# Patient Record
Sex: Male | Born: 1960 | Race: White | Hispanic: No | Marital: Married | Smoking: Never smoker
Health system: Southern US, Community
[De-identification: ages and names within clinical notes are randomized; demographics above are authoritative.]

## PROBLEM LIST (undated history)

## (undated) DIAGNOSIS — M1990 Unspecified osteoarthritis, unspecified site: Secondary | ICD-10-CM

## (undated) DIAGNOSIS — I471 Supraventricular tachycardia, unspecified: Secondary | ICD-10-CM

## (undated) DIAGNOSIS — E785 Hyperlipidemia, unspecified: Secondary | ICD-10-CM

## (undated) DIAGNOSIS — G43909 Migraine, unspecified, not intractable, without status migrainosus: Secondary | ICD-10-CM

## (undated) DIAGNOSIS — R000 Tachycardia, unspecified: Secondary | ICD-10-CM

## (undated) DIAGNOSIS — R55 Syncope and collapse: Secondary | ICD-10-CM

## (undated) DIAGNOSIS — I77810 Thoracic aortic ectasia: Secondary | ICD-10-CM

## (undated) DIAGNOSIS — I10 Essential (primary) hypertension: Secondary | ICD-10-CM

## (undated) DIAGNOSIS — R7989 Other specified abnormal findings of blood chemistry: Secondary | ICD-10-CM

## (undated) DIAGNOSIS — I491 Atrial premature depolarization: Secondary | ICD-10-CM

## (undated) DIAGNOSIS — I493 Ventricular premature depolarization: Secondary | ICD-10-CM

## (undated) HISTORY — DX: Syncope and collapse: R55

## (undated) HISTORY — DX: Hyperlipidemia, unspecified: E78.5

## (undated) HISTORY — DX: Supraventricular tachycardia: I47.1

## (undated) HISTORY — PX: OTHER SURGICAL HISTORY: SHX169

## (undated) HISTORY — DX: Unspecified osteoarthritis, unspecified site: M19.90

## (undated) HISTORY — PX: TONSILLECTOMY: SUR1361

## (undated) HISTORY — DX: Thoracic aortic ectasia: I77.810

## (undated) HISTORY — DX: Atrial premature depolarization: I49.1

## (undated) HISTORY — PX: CHOLECYSTECTOMY: SHX55

## (undated) HISTORY — PX: APPENDECTOMY: SHX54

## (undated) HISTORY — DX: Essential (primary) hypertension: I10

## (undated) HISTORY — DX: Other specified abnormal findings of blood chemistry: R79.89

## (undated) HISTORY — DX: Migraine, unspecified, not intractable, without status migrainosus: G43.909

## (undated) HISTORY — DX: Tachycardia, unspecified: R00.0

## (undated) HISTORY — DX: Supraventricular tachycardia, unspecified: I47.10

## (undated) HISTORY — DX: Ventricular premature depolarization: I49.3

---

## 2015-05-14 ENCOUNTER — Encounter: Payer: Self-pay | Admitting: Internal Medicine

## 2015-05-14 ENCOUNTER — Ambulatory Visit (INDEPENDENT_AMBULATORY_CARE_PROVIDER_SITE_OTHER): Payer: BLUE CROSS/BLUE SHIELD | Admitting: Internal Medicine

## 2015-05-14 VITALS — BP 146/90 | HR 74 | Temp 97.8°F | Resp 16 | Ht 74.0 in | Wt 232.0 lb

## 2015-05-14 DIAGNOSIS — M150 Primary generalized (osteo)arthritis: Secondary | ICD-10-CM | POA: Diagnosis not present

## 2015-05-14 DIAGNOSIS — G43809 Other migraine, not intractable, without status migrainosus: Secondary | ICD-10-CM | POA: Diagnosis not present

## 2015-05-14 DIAGNOSIS — I10 Essential (primary) hypertension: Secondary | ICD-10-CM | POA: Insufficient documentation

## 2015-05-14 DIAGNOSIS — IMO0001 Reserved for inherently not codable concepts without codable children: Secondary | ICD-10-CM

## 2015-05-14 DIAGNOSIS — M159 Polyosteoarthritis, unspecified: Secondary | ICD-10-CM

## 2015-05-14 DIAGNOSIS — R030 Elevated blood-pressure reading, without diagnosis of hypertension: Secondary | ICD-10-CM

## 2015-05-14 DIAGNOSIS — Z9889 Other specified postprocedural states: Secondary | ICD-10-CM | POA: Diagnosis not present

## 2015-05-14 DIAGNOSIS — G43909 Migraine, unspecified, not intractable, without status migrainosus: Secondary | ICD-10-CM | POA: Insufficient documentation

## 2015-05-14 DIAGNOSIS — M1990 Unspecified osteoarthritis, unspecified site: Secondary | ICD-10-CM | POA: Insufficient documentation

## 2015-05-14 MED ORDER — EPINEPHRINE 0.3 MG/0.3ML IJ SOAJ: 0.3000 mg | Freq: Once | INTRAMUSCULAR | Status: DC

## 2015-05-14 NOTE — Progress Notes (Signed)
Pre visit review using our clinic review tool, if applicable. No additional management support is needed unless otherwise documented below in the visit note. 

## 2015-05-14 NOTE — Assessment & Plan Note (Signed)
Takes imitrex as needed, which is effective Continue imitrex a needed

## 2015-05-14 NOTE — Assessment & Plan Note (Signed)
Occasional transient skipped beat or palpitations - no concerning symptoms

## 2015-05-14 NOTE — Assessment & Plan Note (Addendum)
Exercising Can try glucosamine, tumeric or fish oil Working on weight loss

## 2015-05-14 NOTE — Patient Instructions (Addendum)
   No immunizations administered today.   Medications reviewed and updated.  No changes recommended at this time.  Your prescription(s) have been submitted to your pharmacy. Please take as directed and contact our office if you believe you are having problem(s) with the medication(s).   Please followup annually    

## 2015-05-14 NOTE — Assessment & Plan Note (Signed)
Elevated with increased weight He has started to exercise regularly and has lost weight He is monitoring his BP at home and it is coming down Goal less than 140/90 Work on decreasing sodium
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
## 2015-06-21 ENCOUNTER — Telehealth: Payer: Self-pay | Admitting: Internal Medicine

## 2015-06-21 DIAGNOSIS — Z9889 Other specified postprocedural states: Secondary | ICD-10-CM

## 2015-06-21 DIAGNOSIS — R002 Palpitations: Secondary | ICD-10-CM

## 2015-06-21 NOTE — Telephone Encounter (Signed)
Patient is requesting a referral to establish with a cardiologist based off of his health history he went over with Dr. Lawerance BachBurns on.

## 2015-06-21 NOTE — Telephone Encounter (Signed)
Please advise 

## 2015-06-21 NOTE — Telephone Encounter (Signed)
Ordered. He does need to get his cardiology records for them.

## 2015-06-25 NOTE — Telephone Encounter (Signed)
LVM informing pt

## 2015-07-20 ENCOUNTER — Encounter: Payer: Self-pay | Admitting: Internal Medicine

## 2015-08-27 ENCOUNTER — Encounter: Payer: Self-pay | Admitting: *Deleted

## 2015-09-13 ENCOUNTER — Encounter: Payer: Self-pay | Admitting: Interventional Cardiology

## 2015-09-13 ENCOUNTER — Encounter (INDEPENDENT_AMBULATORY_CARE_PROVIDER_SITE_OTHER): Payer: Self-pay

## 2015-09-13 ENCOUNTER — Ambulatory Visit (INDEPENDENT_AMBULATORY_CARE_PROVIDER_SITE_OTHER): Payer: BLUE CROSS/BLUE SHIELD | Admitting: Interventional Cardiology

## 2015-09-13 VITALS — BP 140/101 | HR 63 | Ht 74.0 in | Wt 225.0 lb

## 2015-09-13 DIAGNOSIS — I493 Ventricular premature depolarization: Secondary | ICD-10-CM | POA: Diagnosis not present

## 2015-09-13 DIAGNOSIS — Z9889 Other specified postprocedural states: Secondary | ICD-10-CM

## 2015-09-13 DIAGNOSIS — I471 Supraventricular tachycardia: Secondary | ICD-10-CM

## 2015-09-13 DIAGNOSIS — R030 Elevated blood-pressure reading, without diagnosis of hypertension: Secondary | ICD-10-CM | POA: Diagnosis not present

## 2015-09-13 DIAGNOSIS — I491 Atrial premature depolarization: Secondary | ICD-10-CM

## 2015-09-13 DIAGNOSIS — IMO0001 Reserved for inherently not codable concepts without codable children: Secondary | ICD-10-CM

## 2015-09-13 NOTE — Patient Instructions (Signed)
**Note De-identified Myer Bohlman Obfuscation** Medication Instructions:  Same-no changes  Labwork: None  Testing/Procedures: None  Follow-Up: Your physician wants you to follow-up in: 1 year. You will receive a reminder letter in the mail two months in advance. If you don't receive a letter, please call our office to schedule the follow-up appointment.      If you need a refill on your cardiac medications before your next appointment, please call your pharmacy.   

## 2015-09-13 NOTE — Progress Notes (Signed)
Cardiology Office Note   Date:  09/13/2015   ID:  Daniel Petty, DOB 1960-09-05, MRN [id number]  PCP:  Pincus Sanes, MD    No chief complaint on file.  F/u AVNRT  Wt Readings from Last 3 Encounters:  09/13/15 225 lb (102.1 kg)  05/14/15 232 lb (105.2 kg)       History of Present Illness: Daniel Petty is a 55 y.o. male  Who had a h/o SVT/ AVNRT.  He had am ablation in 2015.  He had syncope with HRs in the 200s prior to the ablation.  Since the ablation, he has not had any episodes like this.  Doing yardwork in the heat would cause these arrhythmias.  He had a negative stress test.   With exercise, he has occasional skipped beats.  He gets back into riding a bike.  He wants to run and lift weights. Monitor in 2015 showed NSR with PACs, PVCs.   3/15: ETT negative ; 3/15 echo: normal LV function  He has had hyperlipidemia and was on Crestor but stopped this recently.  He is watching his diet and the lipids are similar.   No family h/o CAD.  No early MI in the family.    Past Medical History:  Diagnosis Date  . Arthritis   . Hyperlipemia   . Hypertension   . Migraine   . Syncope   . Tachycardia    6 [place]    Past Surgical History:  Procedure Laterality Date  . APPENDECTOMY    . CHOLECYSTECTOMY    . psvt typical    . TONSILLECTOMY    . typical AVNRT       Current Outpatient Prescriptions  Medication Sig Dispense Refill  . EPINEPHrine 0.3 mg/0.3 mL IJ SOAJ injection Inject 0.3 mLs (0.3 mg total) into the muscle once. 2 Device 3  . SUMAtriptan Succinate (IMITREX PO) Take 1 tablet by mouth as needed (MIGRAINE).      No current facility-administered medications for this visit.     Allergies:   Penicillins; Yellow jacket venom; and Sulfa antibiotics    Social History:  The patient  reports that he has never smoked. He has never used smokeless tobacco. He reports that he drinks alcohol. He reports that he does not use drugs.    Family History:  The patient's family history includes Arrhythmia in his mother; Heart attack in his mother; Heart disease in his father; Hyperlipidemia in his maternal grandmother and mother; Multiple sclerosis in his brother and mother.    ROS:  Please see the history of present illness.   Otherwise, review of systems are positive for rare PACs and PVCs.   All other systems are reviewed and negative.    PHYSICAL EXAM: VS:  BP (!) 140/101   Pulse 63   Ht  (1.88 m)   Wt 225 lb (102.1 kg)   BMI 28.89 kg/m  , BMI Body mass index is 28.89 kg/m. GEN: Well nourished, well developed, in no acute distress  HEENT: normal  Neck: no JVD, carotid bruits, or masses Cardiac: RRR; no murmurs, rubs, or gallops,no edema  Respiratory:  clear to auscultation bilaterally, normal work of breathing GI: soft, nontender, nondistended, + BS MS: no deformity or atrophy  Skin: warm and dry, no rash Neuro:  Strength and sensation are intact Psych: euthymic mood, full affect   EKG:   The ekg ordered today demonstrates normal sinus rhythm, no ST segment  changes   Recent Labs: No results found for requested labs within last 8760 hours.   Lipid Panel No results found for: CHOL, TRIG, HDL, CHOLHDL, VLDL, LDLCALC, LDLDIRECT   Other studies Reviewed: Additional studies/ records that were reviewed today with results demonstrating: Normal ETT in 3/15; Normal LV function, tr AI/MR/TR .   ASSESSMENT AND PLAN:  1. 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 Status post ablation while he was living in [place] in 2015. 2. PACs, PVCs: Stable. He experiences these during exercise. Unchanged for several years. 3. Hyperlipidemia: Trying to manage with diet and exercise. Labs checked by his primary care physician. 4. Excellent exercise tolerance by most recent stress test. He will increase his activity level gradually. If he has any problems, he'll let us know. 5. Borderline BP, also managed with diet and  exercise.   Current medicines are reviewed at length with the patient today.  The patient concerns regarding his medicines were addressed.  The following changes have been made:  No change  Labs/ tests ordered today include:  No orders of the defined types were placed in this encounter.   Recommend 150 minutes/week of aerobic exercise Low fat, low carb, high fiber diet recommended  Disposition:   FU in 1 year   Signed, Lance Muss, MD  09/13/2015 11:42 AM    [facility] [address] [place], [place], [place]  [address] Phone: [phone]; Fax: [phone]

## 2015-12-21 ENCOUNTER — Ambulatory Visit (INDEPENDENT_AMBULATORY_CARE_PROVIDER_SITE_OTHER)
Admission: RE | Admit: 2015-12-21 | Discharge: 2015-12-21 | Disposition: A | Discharge status: Home / Self Care | Payer: BLUE CROSS/BLUE SHIELD | Source: Ambulatory Visit | Attending: Nurse Practitioner | Admitting: Nurse Practitioner

## 2015-12-21 ENCOUNTER — Ambulatory Visit (INDEPENDENT_AMBULATORY_CARE_PROVIDER_SITE_OTHER): Payer: BLUE CROSS/BLUE SHIELD | Admitting: Nurse Practitioner

## 2015-12-21 ENCOUNTER — Encounter: Payer: Self-pay | Admitting: Nurse Practitioner

## 2015-12-21 ENCOUNTER — Other Ambulatory Visit: Payer: Self-pay | Admitting: *Deleted

## 2015-12-21 VITALS — BP 162/110 | HR 86 | Temp 97.6°F | Ht 74.0 in | Wt 236.0 lb

## 2015-12-21 DIAGNOSIS — I10 Essential (primary) hypertension: Secondary | ICD-10-CM | POA: Diagnosis not present

## 2015-12-21 DIAGNOSIS — R053 Chronic cough: Secondary | ICD-10-CM

## 2015-12-21 DIAGNOSIS — R05 Cough: Secondary | ICD-10-CM

## 2015-12-21 DIAGNOSIS — R0982 Postnasal drip: Secondary | ICD-10-CM | POA: Diagnosis not present

## 2015-12-21 MED ORDER — MOMETASONE FUROATE 50 MCG/ACT NA SUSP: 2.0000 | Freq: Every day | NASAL | 2 refills | Status: DC

## 2015-12-21 MED ORDER — AMLODIPINE BESYLATE 5 MG PO TABS: 5.0000 mg | ORAL_TABLET | Freq: Every day | ORAL | 3 refills | Status: DC

## 2015-12-21 MED ORDER — MONTELUKAST SODIUM 10 MG PO TABS: 10.0000 mg | ORAL_TABLET | Freq: Every day | ORAL | 3 refills | Status: DC

## 2015-12-21 MED ORDER — HYDROCODONE-HOMATROPINE 5-1.5 MG/5ML PO SYRP: 5.0000 mL | ORAL_SOLUTION | Freq: Every evening | ORAL | 0 refills | Status: DC | PRN

## 2015-12-21 MED ORDER — ALBUTEROL SULFATE HFA 108 (90 BASE) MCG/ACT IN AERS: 1.0000 | INHALATION_SPRAY | Freq: Four times a day (QID) | RESPIRATORY_TRACT | 0 refills | Status: DC | PRN

## 2015-12-21 NOTE — Patient Instructions (Addendum)
You will be called with CXR results  Please check BP once a day and record. Bring BP records to next office visit.  Do not take any medication with decongestant

## 2015-12-21 NOTE — Progress Notes (Signed)
Subjective:  Patient ID: Daniel Petty, male    DOB: 12/15/1960  Age: 55 y.o. MRN: [id number]  CC: Cough (Pt stated coughing and headache for six weeks.)   Cough  This is a new problem. The current episode started more than 1 month ago. The problem has been waxing and waning. The problem occurs constantly. The cough is non-productive. Associated symptoms include chest pain, headaches, nasal congestion, postnasal drip and a sore throat. Pertinent negatives include no chills, ear congestion, ear pain, fever, heartburn, hemoptysis, myalgias, rash, rhinorrhea, shortness of breath, sweats, weight loss or wheezing. He has tried OTC cough suppressant for the symptoms. The treatment provided mild relief. His past medical history is significant for environmental allergies. There is no history of asthma, bronchitis, COPD, emphysema or pneumonia.    Outpatient Medications Prior to Visit  Medication Sig Dispense Refill  . EPINEPHrine 0.3 mg/0.3 mL IJ SOAJ injection Inject 0.3 mLs (0.3 mg total) into the muscle once. 2 Device 3  . FLUCELVAX QUADRIVALENT 0.5 ML SUSY     . SUMAtriptan Succinate (IMITREX PO) Take 1 tablet by mouth as needed (MIGRAINE).      No facility-administered medications prior to visit.     ROS See HPI  Objective:  BP (!) 162/110 (BP Location: Left Arm, Patient Position: Sitting, Cuff Size: Normal)   Pulse 86   Temp 97.6 F (36.4 C)   Ht 6\' 2"  (1.88 m)   Wt 236 lb (107 kg)   SpO2 98%   BMI 30.30 kg/m   BP Readings from Last 3 Encounters:  12/21/15 (!) 162/110  09/13/15 (!) 140/101  05/14/15 (!) 146/90    Wt Readings from Last 3 Encounters:  12/21/15 236 lb (107 kg)  09/13/15 225 lb (102.1 kg)  05/14/15 232 lb (105.2 kg)    Physical Exam  Constitutional: He is oriented to person, place, and time. No distress.  HENT:  Right Ear: Tympanic membrane, external ear and ear canal normal.  Left Ear: Tympanic membrane and ear canal normal.  Nose: Mucosal edema and  rhinorrhea present. Right sinus exhibits no maxillary sinus tenderness and no frontal sinus tenderness. Left sinus exhibits no maxillary sinus tenderness and no frontal sinus tenderness.  Mouth/Throat: Uvula is midline. Posterior oropharyngeal erythema present. No oropharyngeal exudate.  Eyes: No scleral icterus.  Neck: Normal range of motion. Neck supple.  Cardiovascular: Normal rate and regular rhythm.   Pulmonary/Chest: Effort normal and breath sounds normal.  Musculoskeletal: Normal range of motion. He exhibits no edema.  Lymphadenopathy:    He has no cervical adenopathy.  Neurological: He is alert and oriented to person, place, and time.  Skin: Skin is warm and dry. No rash noted. No erythema.  Vitals reviewed.   No results found for: WBC, HGB, HCT, PLT, GLUCOSE, CHOL, TRIG, HDL, LDLDIRECT, LDLCALC, ALT, AST, NA, K, CL, CREATININE, BUN, CO2, TSH, PSA, INR, GLUF, HGBA1C, MICROALBUR  Patient was never admitted.   normal CXR today  Assessment & Plan:   Loraine LericheMark was seen today for cough.  Diagnoses and all orders for this visit:  Essential hypertension, benign -     amLODipine (NORVASC) 5 MG tablet; Take 1 tablet (5 mg total) by mouth at bedtime.  Cough, persistent -     albuterol (PROVENTIL HFA;VENTOLIN HFA) 108 (90 Base) MCG/ACT inhaler; Inhale 1-2 puffs into the lungs every 6 (six) hours as needed for wheezing or shortness of breath. -     DG Chest 2 View; Future -  montelukast (SINGULAIR) 10 MG tablet; Take 1 tablet (10 mg total) by mouth at bedtime. -     HYDROcodone-homatropine (HYCODAN) 5-1.5 MG/5ML syrup; Take 5 mLs by mouth at bedtime as needed for cough.  PND (post-nasal drip) -     montelukast (SINGULAIR) 10 MG tablet; Take 1 tablet (10 mg total) by mouth at bedtime. -     mometasone (NASONEX) 50 MCG/ACT nasal spray; Place 2 sprays into the nose daily.   I am having Mr. Teddy SpikeDilliplaine start on amLODipine, albuterol, montelukast, HYDROcodone-homatropine, and mometasone.  I am also having him maintain his SUMAtriptan Succinate (IMITREX PO), EPINEPHrine, and FLUCELVAX QUADRIVALENT.  Meds ordered this encounter  Medications  . amLODipine (NORVASC) 5 MG tablet    Sig: Take 1 tablet (5 mg total) by mouth at bedtime.    Dispense:  30 tablet    Refill:  3    Order Specific Question:   Supervising Provider    Answer:   Tresa GarterPLOTNIKOV, ALEKSEI V [[id number]]  . albuterol (PROVENTIL HFA;VENTOLIN HFA) 108 (90 Base) MCG/ACT inhaler    Sig: Inhale 1-2 puffs into the lungs every 6 (six) hours as needed for wheezing or shortness of breath.    Dispense:  1 Inhaler    Refill:  0    Order Specific Question:   Supervising Provider    Answer:   Tresa GarterPLOTNIKOV, ALEKSEI V [[id number]]  . montelukast (SINGULAIR) 10 MG tablet    Sig: Take 1 tablet (10 mg total) by mouth at bedtime.    Dispense:  30 tablet    Refill:  3    Order Specific Question:   Supervising Provider    Answer:   Tresa GarterPLOTNIKOV, ALEKSEI V [[id number]]  . HYDROcodone-homatropine (HYCODAN) 5-1.5 MG/5ML syrup    Sig: Take 5 mLs by mouth at bedtime as needed for cough.    Dispense:  120 mL    Refill:  0    Order Specific Question:   Supervising Provider    Answer:   Tresa GarterPLOTNIKOV, ALEKSEI V [[id number]]  . mometasone (NASONEX) 50 MCG/ACT nasal spray    Sig: Place 2 sprays into the nose daily.    Dispense:  17 g    Refill:  2    Order Specific Question:   Supervising Provider    Answer:   Tresa GarterPLOTNIKOV, ALEKSEI V [[id number]]   Follow-up: Return in about 1 month (around 01/20/2016) for HTN and cough.  Alysia Pennaharlotte Elizet Kaplan, NP

## 2016-01-12 ENCOUNTER — Other Ambulatory Visit: Payer: Self-pay | Admitting: Nurse Practitioner

## 2016-01-12 DIAGNOSIS — R05 Cough: Secondary | ICD-10-CM

## 2016-01-12 DIAGNOSIS — R053 Chronic cough: Secondary | ICD-10-CM

## 2016-01-14 NOTE — Telephone Encounter (Signed)
Routing to [place], I dont see this med currently on patients chart----please advise, thanks

## 2016-01-22 ENCOUNTER — Encounter: Payer: Self-pay | Admitting: Internal Medicine

## 2016-01-22 ENCOUNTER — Ambulatory Visit (INDEPENDENT_AMBULATORY_CARE_PROVIDER_SITE_OTHER): Payer: BLUE CROSS/BLUE SHIELD | Admitting: Internal Medicine

## 2016-01-22 VITALS — BP 126/88 | HR 90 | Temp 98.2°F | Resp 16 | Wt 231.0 lb

## 2016-01-22 DIAGNOSIS — R05 Cough: Secondary | ICD-10-CM | POA: Diagnosis not present

## 2016-01-22 DIAGNOSIS — G43809 Other migraine, not intractable, without status migrainosus: Secondary | ICD-10-CM

## 2016-01-22 DIAGNOSIS — R059 Cough, unspecified: Secondary | ICD-10-CM

## 2016-01-22 DIAGNOSIS — I10 Essential (primary) hypertension: Secondary | ICD-10-CM

## 2016-01-22 LAB — POCT EXHALED NITRIC OXIDE: FeNO level (ppb): 12

## 2016-01-22 MED ORDER — OMEPRAZOLE 40 MG PO CPDR: 40.0000 mg | DELAYED_RELEASE_CAPSULE | Freq: Every day | ORAL | 3 refills | Status: DC

## 2016-01-22 NOTE — Patient Instructions (Addendum)
   All other Health Maintenance issues reviewed.   All recommended immunizations and age-appropriate screenings are up-to-date or discussed.  No immunizations administered today.   Medications reviewed and updated.  Changes include starting omeprazole 40 mg daily.  Your prescription(s) have been submitted to your pharmacy. Please take as directed and contact our office if you believe you are having problem(s) with the medication(s).   Please followup in 6 months

## 2016-01-22 NOTE — Progress Notes (Signed)
Pre visit review using our clinic review tool, if applicable. No additional management support is needed unless otherwise documented below in the visit note. 

## 2016-01-22 NOTE — Assessment & Plan Note (Signed)
Blood pressure well-controlled at home, diastolic slightly elevated here today Continue current medication at current doses Continue regular exercise and low sodium diet

## 2016-01-22 NOTE — Assessment & Plan Note (Signed)
Has a migraine approximately every 6 weeks Takes Imitrex as needed and is effective Continue above

## 2016-01-22 NOTE — Assessment & Plan Note (Addendum)
Started 3 months ago CXR neg 12/21/15 ? Allergies, GERD FENO done today - 12, unlikely asthma Has been taking allergy medication-some improvement  He can continue the allergy medication if helpful Deferred ENT or GI referral for evaluation of GERD Start omeprazole 40 mg daily for possible silent GERD Discussed at that this medication should be temporary and he should make lifestyle changes if he does have improvement in the cough Reviewed GERD diet

## 2016-01-22 NOTE — Progress Notes (Signed)
Subjective:    Patient ID: Daniel Petty, male    DOB: 09/01/1960, 55 y.o.   MRN: [id number]  HPI The patient is here for follow up.  Chronic cough: it started about 3 months ago.  It is dry.  He has occasional wheeze and chest tightness.  He denies SOB, fever.  He was here recently(one month ago) and a chest xray was normal.  He was prescribed an inhaler, nasal spray, singular.  The nasal spray causes headaches so he stopped it.  The others seem to help. Cough is worse late in day.  Cough drops help a little.   It is better with walking outside.  He had allergies as a child, but grew out of them.  He denies GERD.   Hypertension: He is taking his medication daily. He is compliant with a low sodium diet.  He denies chest pain, palpitations, edema, shortness of breath and regular headaches. He is exercising regularly - walking, weights.  He does monitor his blood pressure at home - 125/82.    Migraine headaches:  He continues to take imitrex only as needed which is about once every 6 weeks.  The imitrex works well and he denies side effects from the medication.  Medications and allergies reviewed with patient and updated if appropriate.  Patient Active Problem List   Diagnosis Date Noted  . PAC (premature atrial contraction) 09/13/2015  . PVC (premature ventricular contraction) 09/13/2015  . Migraines 05/14/2015  . Osteoarthritis 05/14/2015  . History of cardiac radiofrequency ablation 05/14/2015  . Essential hypertension, benign 05/14/2015    Current Outpatient Prescriptions on File Prior to Visit  Medication Sig Dispense Refill  . amLODipine (NORVASC) 5 MG tablet Take 1 tablet (5 mg total) by mouth at bedtime. 30 tablet 3  . EPINEPHrine 0.3 mg/0.3 mL IJ SOAJ injection Inject 0.3 mLs (0.3 mg total) into the muscle once. 2 Device 3  . montelukast (SINGULAIR) 10 MG tablet Take 1 tablet (10 mg total) by mouth at bedtime. 30 tablet 3  . PROAIR HFA 108 (90 Base) MCG/ACT inhaler INHALE 1  TO 2 PUFFS INTO THE LUNGS EVERY 6 HOURS AS NEEDED FOR WHEEZING OR SHORTNESS OF BREATH 8.5 g 0  . SUMAtriptan Succinate (IMITREX PO) Take 1 tablet by mouth as needed (MIGRAINE).      No current facility-administered medications on file prior to visit.     Past Medical History:  Diagnosis Date  . Arthritis   . Hyperlipemia   . Hypertension   . Migraine   . Syncope   . Tachycardia    6 [place]    Past Surgical History:  Procedure Laterality Date  . APPENDECTOMY    . CHOLECYSTECTOMY    . psvt typical    . TONSILLECTOMY    . typical AVNRT      Social History   Social History  . Marital status: Married    Spouse name: N/A  . Number of children: N/A  . Years of education: N/A   Social History Main Topics  . Smoking status: Never Smoker  . Smokeless tobacco: Never Used  . Alcohol use Yes  . Drug use: No  . Sexual activity: Not Asked   Other Topics Concern  . None   Social History Narrative   Exercise: regular    Family History  Problem Relation Age of Onset  . Hyperlipidemia Mother   . Heart attack Mother   . Multiple sclerosis Mother   .  Arrhythmia Mother   . Heart disease Father   . Multiple sclerosis Brother   . Hyperlipidemia Maternal Grandmother     Review of Systems  Constitutional: Negative for fever.  HENT: Positive for postnasal drip (at times). Negative for congestion.   Respiratory: Positive for cough, chest tightness and wheezing (occ). Negative for shortness of breath.   Cardiovascular: Negative for chest pain, palpitations and leg swelling.  Gastrointestinal: Negative for nausea.       No gerd, increased burping  Neurological: Positive for headaches (daily). Negative for dizziness and light-headedness.       Objective:   Vitals:   01/22/16 0928  BP: 126/88  Pulse: 90  Resp: 16  Temp: 98.2 F (36.8 C)   Filed Weights   01/22/16 0928  Weight: 231 lb (104.8 kg)   Body mass index is 29.66 kg/m.   Physical  Exam    Constitutional: Appears well-developed and well-nourished. No distress.  HENT:  Head: Normocephalic and atraumatic.  Neck: Neck supple. No tracheal deviation present. No thyromegaly present.  No cervical lymphadenopathy Cardiovascular: Normal rate, regular rhythm and normal heart sounds.   No murmur heard. No carotid bruit .  No edema Pulmonary/Chest: Effort normal and breath sounds normal. No respiratory distress. No has no wheezes. No rales.  Skin: Skin is warm and dry. Not diaphoretic.  Psychiatric: Normal mood and affect. Behavior is normal.      Assessment & Plan:    See Problem List for Assessment and Plan of chronic medical problems.   F/u in 6 months

## 2016-02-19 ENCOUNTER — Encounter: Payer: Self-pay | Admitting: Internal Medicine

## 2016-04-18 ENCOUNTER — Other Ambulatory Visit: Payer: Self-pay | Admitting: Nurse Practitioner

## 2016-04-18 DIAGNOSIS — I10 Essential (primary) hypertension: Secondary | ICD-10-CM

## 2016-05-21 ENCOUNTER — Other Ambulatory Visit: Payer: Self-pay | Admitting: Internal Medicine

## 2016-09-11 ENCOUNTER — Encounter: Payer: Self-pay | Admitting: Family Medicine

## 2016-09-11 ENCOUNTER — Ambulatory Visit (INDEPENDENT_AMBULATORY_CARE_PROVIDER_SITE_OTHER): Payer: Managed Care, Other (non HMO) | Admitting: Family Medicine

## 2016-09-11 ENCOUNTER — Other Ambulatory Visit (INDEPENDENT_AMBULATORY_CARE_PROVIDER_SITE_OTHER): Payer: Managed Care, Other (non HMO)

## 2016-09-11 VITALS — BP 140/88 | HR 64 | Temp 97.8°F | Ht 74.0 in | Wt 223.0 lb

## 2016-09-11 DIAGNOSIS — R5383 Other fatigue: Secondary | ICD-10-CM | POA: Diagnosis not present

## 2016-09-11 DIAGNOSIS — R5381 Other malaise: Secondary | ICD-10-CM | POA: Diagnosis not present

## 2016-09-11 LAB — CBC WITH DIFFERENTIAL/PLATELET
BASOS PCT: 1 % (ref 0.0–3.0)
Basophils Absolute: 0.1 10*3/uL (ref 0.0–0.1)
EOS PCT: 2.2 % (ref 0.0–5.0)
Eosinophils Absolute: 0.2 10*3/uL (ref 0.0–0.7)
HEMATOCRIT: 44.2 % (ref 39.0–52.0)
HEMOGLOBIN: 14.8 g/dL (ref 13.0–17.0)
LYMPHS PCT: 25.9 % (ref 12.0–46.0)
Lymphs Abs: 2 10*3/uL (ref 0.7–4.0)
MCHC: 33.6 g/dL (ref 30.0–36.0)
MCV: 90.4 fl (ref 78.0–100.0)
MONOS PCT: 7.7 % (ref 3.0–12.0)
Monocytes Absolute: 0.6 10*3/uL (ref 0.1–1.0)
Neutro Abs: 4.9 10*3/uL (ref 1.4–7.7)
Neutrophils Relative %: 63.2 % (ref 43.0–77.0)
Platelets: 275 10*3/uL (ref 150.0–400.0)
RBC: 4.89 Mil/uL (ref 4.22–5.81)
RDW: 13.5 % (ref 11.5–15.5)
WBC: 7.8 10*3/uL (ref 4.0–10.5)

## 2016-09-11 LAB — COMPREHENSIVE METABOLIC PANEL
ALBUMIN: 4.5 g/dL (ref 3.5–5.2)
ALK PHOS: 92 U/L (ref 39–117)
ALT: 12 U/L (ref 0–53)
AST: 12 U/L (ref 0–37)
BUN: 11 mg/dL (ref 6–23)
CALCIUM: 9.1 mg/dL (ref 8.4–10.5)
CHLORIDE: 104 meq/L (ref 96–112)
CO2: 29 mEq/L (ref 19–32)
Creatinine, Ser: 0.72 mg/dL (ref 0.40–1.50)
GFR: 119.1 mL/min (ref >60.00)
Glucose, Bld: 87 mg/dL (ref 70–99)
POTASSIUM: 4.6 meq/L (ref 3.5–5.1)
SODIUM: 138 meq/L (ref 135–145)
TOTAL PROTEIN: 6.9 g/dL (ref 6.0–8.3)
Total Bilirubin: 1.2 mg/dL (ref 0.2–1.2)

## 2016-09-11 LAB — C-REACTIVE PROTEIN: CRP: 0.1 mg/dL — ABNORMAL LOW (ref 0.5–20.0)

## 2016-09-11 LAB — TSH: TSH: 1.92 u[IU]/mL (ref 0.35–4.50)

## 2016-09-11 LAB — SEDIMENTATION RATE: SED RATE: 4 mm/h (ref 0–20)

## 2016-09-11 NOTE — Assessment & Plan Note (Signed)
Unclear source of his fatigue. Does have a family history of some autoimmune diseases. His exam does not demonstrate any of this today. Reviewed his lab work. - Lab work collected today. - If lab work is inconclusive then can consider a sleep study. - Given indications to return.

## 2016-09-11 NOTE — Progress Notes (Signed)
Daniel Petty - 56 y.o. male MRN [id number]  Date of birth: 07/03/1960  SUBJECTIVE:  Including CC & ROS.  Chief Complaint  Patient presents with  . Fatigue    extreme fatique with joint pain more than normal and more frequent migraines. Patient states lump behind right ear and swollen glands in neck    Daniel Petty is a 56 year old male that is presenting with fatigue and malaise. He reports these have been worsening over the past 6 months. He has not traveled outside of the country recently. He denies any new pets. Denies any tick bites. He denies any rashes. He has an extensive family history of lupus, rheumatoid arthritis, and osteoarthritis. He does have some joint swelling but denies any red or swollen or tender joints. He denies any fullness in his neck. He did notice a swollen lymph node on the right side of his neck. He denies any nausea or vomiting. He denies any recent illness. He denies any sick contacts.      Review of Systems  Constitutional: Negative for fever.  HENT: Negative for trouble swallowing.   Respiratory: Negative for shortness of breath.   Cardiovascular: Negative for chest pain.  Gastrointestinal: Negative for nausea and vomiting.  Endocrine: Negative for cold intolerance and heat intolerance.  Musculoskeletal: Positive for arthralgias and joint swelling.  Skin: Negative for rash.  Neurological: Negative for weakness and numbness.  Hematological: Positive for adenopathy.  Psychiatric/Behavioral: Negative for confusion.  otherwise negative  HISTORY: Past Medical, Surgical, Social, and Family History Reviewed & Updated per EMR.   Pertinent Historical Findings include:  Past Medical History:  Diagnosis Date  . Arthritis   . Hyperlipemia   . Hypertension   . Migraine   . Syncope   . Tachycardia    [place]    Past Surgical History:  Procedure Laterality Date  . APPENDECTOMY    . CHOLECYSTECTOMY    . psvt typical    .  TONSILLECTOMY    . typical AVNRT      Allergies  Allergen Reactions  . Penicillins Anaphylaxis  . Yellow Jacket Venom   . Sulfa Antibiotics Rash    Family History  Problem Relation Age of Onset  . Hyperlipidemia Mother   . Heart attack Mother   . Multiple sclerosis Mother   . Arrhythmia Mother   . Heart disease Father   . Multiple sclerosis Brother   . Hyperlipidemia Maternal Grandmother      Social History   Social History  . Marital status: Married    Spouse name: N/A  . Number of children: N/A  . Years of education: N/A   Occupational History  . Not on file.   Social History Main Topics  . Smoking status: Never Smoker  . Smokeless tobacco: Never Used  . Alcohol use Yes  . Drug use: No  . Sexual activity: Not on file   Other Topics Concern  . Not on file   Social History Narrative   Exercise: regular     PHYSICAL EXAM:  VS: BP 140/88 (BP Location: Left Arm, Patient Position: Sitting, Cuff Size: Normal)   Pulse 64   Temp 97.8 F (36.6 C) (Oral)   Ht 6\' 2"  (1.88 m)   Wt 223 lb (101.2 kg)   SpO2 100%   BMI 28.63 kg/m  Physical Exam  Constitutional: He is oriented to person, place, and time. He appears well-developed and well-nourished.  HENT:  Head: Normocephalic and atraumatic.  Right Ear: External ear normal.  Left Ear: External ear normal.  Eyes: Conjunctivae and EOM are normal.  Neck: Normal range of motion. Neck supple.  Cardiovascular: Normal rate, regular rhythm and normal heart sounds.   Pulmonary/Chest: Effort normal and breath sounds normal. He has no rales.  Abdominal: Soft. Bowel sounds are normal. There is no tenderness.  Musculoskeletal: Normal range of motion. He exhibits no edema.  Lymphadenopathy:    He has cervical adenopathy.  Neurological: He is alert and oriented to person, place, and time.  Skin: Skin is warm. No rash noted.  Psychiatric: He has a normal mood and affect. His behavior is normal.     ASSESSMENT & PLAN:     Malaise and fatigue Unclear source of his fatigue. Does have a family history of some autoimmune diseases. His exam does not demonstrate any of this today. Reviewed his lab work. - Lab work collected today. - If lab work is inconclusive then can consider a sleep study. - Given indications to return.  `sm

## 2016-09-11 NOTE — Patient Instructions (Signed)
Thank you for coming in,   We will call you with results from today. Based on what these show will determine our next steps.   Please feel free to call with any questions or concerns at any time, at [phone]. --Dr. Jordan LikesSchmitz

## 2016-09-12 LAB — ANA: ANA: NEGATIVE

## 2016-09-12 LAB — RHEUMATOID FACTOR: Rhuematoid fact SerPl-aCnc: <14 IU/mL (ref <14)

## 2016-09-12 LAB — CYCLIC CITRUL PEPTIDE ANTIBODY, IGG: Cyclic Citrullin Peptide Ab: <16 Units

## 2016-09-12 LAB — PATHOLOGIST SMEAR REVIEW

## 2016-09-16 NOTE — Progress Notes (Signed)
Subjective:    Patient ID: Daniel Petty, male    DOB: 1960-08-18, 56 y.o.   MRN: [id number]  HPI The patient is here for follow up.  He saw Dr Raeford Razor on 8/2 for fatigue.  He was having joint pain/swelling and migraines.  He had swollen glands in his neck.  The fatigue started 6 months ago.  He was not having fevers.  There was no travel or known tick bites.  He has a family history of autoimmune disease.    Blood work is unremarkable including: ANA, RA, esr/crp, CBC, tsh, cmp.  Fatigue started at least 6 months ago.  He started sleeping more - he was only sleeping 5-6 hrs, but sleeping more did not help.  All his joints hurt and he has some muscle pain.  The headache is constant.  He gets migraines every other week, which is more.  He has a lymph node that is swollen the posterior right neck.  It is not tender.   He thinks it is smaller today.   He is still working.  He is trying to exercise, but that is difficult due to the fatigue.  He denies increased fatigue with exercise.  He denies SOB, chest pain and palpitations.   He has had some erectile dysfunction and that is worse.   He is unsure if it is related or not.   He did have some tick bites last fall.  He does a lot of hiking.   Medications and allergies reviewed with patient and updated if appropriate.  Patient Active Problem List   Diagnosis Date Noted  . Malaise and fatigue 09/11/2016  . Cough 01/22/2016  . PAC (premature atrial contraction) 09/13/2015  . PVC (premature ventricular contraction) 09/13/2015  . Migraines 05/14/2015  . Osteoarthritis 05/14/2015  . History of cardiac radiofrequency ablation 05/14/2015  . Essential hypertension, benign 05/14/2015    Current Outpatient Prescriptions on File Prior to Visit  Medication Sig Dispense Refill  . amLODipine (NORVASC) 5 MG tablet Take 1 tablet (5 mg total) by mouth daily. 30 tablet 5  . EPINEPHrine 0.3 mg/0.3 mL IJ SOAJ injection Inject 0.3 mLs (0.3 mg total)  into the muscle once. 2 Device 3   No current facility-administered medications on file prior to visit.     Past Medical History:  Diagnosis Date  . Arthritis   . Hyperlipemia   . Hypertension   . Migraine   . Syncope   . Tachycardia    6 [place]    Past Surgical History:  Procedure Laterality Date  . APPENDECTOMY    . CHOLECYSTECTOMY    . psvt typical    . TONSILLECTOMY    . typical AVNRT      Social History   Social History  . Marital status: Married    Spouse name: N/A  . Number of children: N/A  . Years of education: N/A   Social History Main Topics  . Smoking status: Never Smoker  . Smokeless tobacco: Never Used  . Alcohol use Yes  . Drug use: No  . Sexual activity: Not Asked   Other Topics Concern  . None   Social History Narrative   Exercise: regular    Family History  Problem Relation Age of Onset  . Hyperlipidemia Mother   . Heart attack Mother   . Multiple sclerosis Mother   . Arrhythmia Mother   . Heart disease Father   . Multiple sclerosis Brother   .  Hyperlipidemia Maternal Grandmother     Review of Systems  Constitutional: Positive for fatigue. Negative for appetite change, chills, fever and unexpected weight change.  Eyes: Negative for visual disturbance.  Respiratory: Negative for cough, shortness of breath and wheezing.        No snoring, no witnessed apnea  Cardiovascular: Positive for palpitations (occasional, not new). Negative for chest pain and leg swelling.  Gastrointestinal: Negative for abdominal pain, blood in stool, constipation, diarrhea and nausea.  Genitourinary: Negative for dysuria and hematuria.  Musculoskeletal: Positive for arthralgias (more than chronic arthritis that was more locallized - this is everywhere - mild), back pain (chronic, unchanged) and myalgias. Negative for joint swelling.  Skin: Positive for rash (the other day - rash on right posterior leg).  Neurological: Positive for  light-headedness (occ) and headaches. Negative for numbness.       Objective:   Vitals:   09/17/16 0943  BP: 124/86  Pulse: 71  Resp: 16  Temp: 98 F (36.7 C)   Wt Readings from Last 3 Encounters:  09/17/16 222 lb (100.7 kg)  09/11/16 223 lb (101.2 kg)  01/22/16 231 lb (104.8 kg)   Body mass index is 28.5 kg/m.   Physical Exam    Constitutional: Appears well-developed and well-nourished. No distress.  HENT:  Head: Normocephalic and atraumatic.  Neck: Neck supple. No tracheal deviation present. No thyromegaly present.  one non tender posterior- lateral neck palpable lymph node. No occipital, auricular, mandibular or cervical lymphadenopathy Cardiovascular: Normal rate, regular rhythm and normal heart sounds.   No murmur heard. No carotid bruit .  No edema Pulmonary/Chest: Effort normal and breath sounds normal. No respiratory distress. No has no wheezes. No rales.  Abdomen: soft, non tender, non distended Msk: no joint swelling or erythema, no joint deformities Skin: Skin is warm and dry. Not diaphoretic.  No rashes Psychiatric: Normal mood and affect. Behavior is normal.      Assessment & Plan:    See Problem List for Assessment and Plan of chronic medical problems.

## 2016-09-17 ENCOUNTER — Other Ambulatory Visit: Payer: Self-pay | Admitting: Internal Medicine

## 2016-09-17 ENCOUNTER — Encounter: Payer: Self-pay | Admitting: Internal Medicine

## 2016-09-17 ENCOUNTER — Ambulatory Visit (INDEPENDENT_AMBULATORY_CARE_PROVIDER_SITE_OTHER): Payer: Managed Care, Other (non HMO) | Admitting: Internal Medicine

## 2016-09-17 ENCOUNTER — Other Ambulatory Visit: Payer: Managed Care, Other (non HMO)

## 2016-09-17 VITALS — BP 124/86 | HR 71 | Temp 98.0°F | Resp 16 | Wt 222.0 lb

## 2016-09-17 DIAGNOSIS — R5381 Other malaise: Secondary | ICD-10-CM

## 2016-09-17 DIAGNOSIS — R5383 Other fatigue: Secondary | ICD-10-CM | POA: Diagnosis not present

## 2016-09-17 DIAGNOSIS — M2550 Pain in unspecified joint: Secondary | ICD-10-CM

## 2016-09-17 NOTE — Patient Instructions (Signed)
  Test(s) ordered today. Your results will be released to MyChart (or called to you) after review, usually within 72hours after test completion. If any changes need to be made, you will be notified at that same time.    Medications reviewed and updated.  No changes recommended at this time.     

## 2016-09-17 NOTE — Assessment & Plan Note (Signed)
Cbc, cmp, rf, ana, ccp, esr, crp normal Present for > 6 months H/o of tick bites - will check lyme Has had some ED - will check testosterone, but I do not think that is the cause of his fatigue Atypical for OSA but will consider a sleep study

## 2016-09-17 NOTE — Assessment & Plan Note (Signed)
Cbc, cmp, rf, ccp, ana, esr, crp normal  Increased joint pain  Will check lyme May need to see rheumatology

## 2016-09-18 LAB — LYME AB/WESTERN BLOT REFLEX

## 2016-09-19 ENCOUNTER — Encounter: Payer: Self-pay | Admitting: Internal Medicine

## 2016-09-19 DIAGNOSIS — R5382 Chronic fatigue, unspecified: Secondary | ICD-10-CM

## 2016-09-19 DIAGNOSIS — R7989 Other specified abnormal findings of blood chemistry: Secondary | ICD-10-CM

## 2016-09-19 LAB — TESTOSTERONE, FREE, TOTAL, SHBG
Sex Hormone Binding: 27.6 nmol/L (ref 19.3–76.4)
Testosterone, Free: 7.5 pg/mL (ref 7.2–24.0)
Testosterone: 240 ng/dL — ABNORMAL LOW (ref 264–916)

## 2016-10-06 NOTE — Progress Notes (Signed)
Cardiology Office Note   Date:  10/07/2016   ID:  Daniel Petty, DOB 1960-08-27, MRN [id number]  PCP:  Binnie Rail, MD    No chief complaint on file.  F/u SVT  Wt Readings from Last 3 Encounters:  10/07/16 222 lb 12.8 oz (101.1 kg)  09/17/16 222 lb (100.7 kg)  09/11/16 223 lb (101.2 kg)       History of Present Illness: Daniel Petty is a 56 y.o. male  Who had a h/o SVT/ AVNRT.  He had am ablation in 2015.  He had syncope with HRs in the 200s prior to the ablation.  Since the ablation, he has not had any episodes like this.  Doing yardwork in the heat would cause these arrhythmias.  He had a negative stress test.   Monitor in 2015 showed NSR with PACs, PVCs.   3/15: ETT negative ; 11METS 3/15 echo: normal LV function  He has had hyperlipidemia and was on Crestor but stopped this due to perceived muscle aches.  He is watching his diet and the lipids are similar.  Pain did not improve quickly.    No family h/o CAD.  No early MI in the family.  He feels some palpitations when he is physically active.  Not like his prior SVT.  Just a skipped beat.  He has been more active and lost 18 lbs.  He tries to avoid salt.  Denies : Chest pain. Dizziness. Leg edema. Nitroglycerin use. Orthopnea. Palpitations. Paroxysmal nocturnal dyspnea. Shortness of breath. Syncope.   Amlodipine was started for HTN. 115/80 is a typically reading.    He reports some fatigue, joint pain and malaise.  He was found to have low T.  He will see urology.    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       Past Medical History:  Diagnosis Date  . Arthritis   . Hyperlipemia   . Hypertension   . Migraine   . Syncope   . Tachycardia    6 [place]    Past Surgical History:  Procedure Laterality Date  . APPENDECTOMY    . CHOLECYSTECTOMY    . psvt typical    . TONSILLECTOMY    . typical AVNRT       Current Outpatient Prescriptions  Medication Sig Dispense  Refill  . amLODipine (NORVASC) 5 MG tablet Take 1 tablet (5 mg total) by mouth daily. 30 tablet 5  . EPINEPHrine 0.3 mg/0.3 mL IJ SOAJ injection Inject 0.3 mLs (0.3 mg total) into the muscle once. 2 Device 3  . SUMAtriptan Succinate (IMITREX PO) Take by mouth daily as needed.     No current facility-administered medications for this visit.     Allergies:   Penicillins; Yellow jacket venom; and Sulfa antibiotics    Social History:  The patient  reports that he has never smoked. He has never used smokeless tobacco. He reports that he drinks alcohol. He reports that he does not use drugs.   Family History:  The patient's family history includes Arrhythmia in his mother; Heart attack in his mother; Heart disease in his father; Hyperlipidemia in his maternal grandmother and mother; Multiple sclerosis in his brother and mother.    ROS:  Please see the history of present illness.   Otherwise, review of systems are positive for intentional weight loss.   All other systems are reviewed and negative.    PHYSICAL EXAM: VS:  BP  126/90   Pulse 66   Ht '6\' 2"'  (1.88 m)   Wt 222 lb 12.8 oz (101.1 kg)   SpO2 96%   BMI 28.61 kg/m  , BMI Body mass index is 28.61 kg/m. GEN: Well nourished, well developed, in no acute distress  HEENT: normal  Neck: no JVD, carotid bruits, or masses Cardiac: RRR; no murmurs, rubs, or gallops,no edema  Respiratory:  clear to auscultation bilaterally, normal work of breathing GI: soft, nontender, nondistended, + BS MS: no deformity or atrophy  Skin: warm and dry, no rash Neuro:  Strength and sensation are intact Psych: euthymic mood, full affect   EKG:   The ekg ordered today demonstrates normal ECG, no ST changes   Recent Labs: 09/11/2016: ALT 12; BUN 11; Creatinine, Ser 0.72; Hemoglobin 14.8; Platelets 275.0; Potassium 4.6; Sodium 138; TSH 1.92   Lipid Panel No results found for: CHOL, TRIG, HDL, CHOLHDL, VLDL, LDLCALC, LDLDIRECT   Other studies  Reviewed: Additional studies/ records that were reviewed today with results demonstrating: Lipids not done.  ESR 4 in 8/18; Hbg: 14.8, TSH 1.9.   ASSESSMENT AND PLAN:  1. SVT : Status post ablation when he was living out of town. No symptoms like his prior SVT.  Has had PACs and PVC in the past. 2. Continue aggressive lifestyle changes. He has lost almost 20 pounds. He is trying to eat healthier and exercise more. This should help his energy level as well. We also spoke about his low testosterone. He will be seeing a urologist and supplementation will likely be considered. We talked about potential increased risk of thrombotic events while on supplemental testosterone. 3. Lipids need to be checked at some point.  THey should improve with his current lifestyle changes.   Current medicines are reviewed at length with the patient today.  The patient concerns regarding his medicines were addressed.  The following changes have been made:  No change  Labs/ tests ordered today include:  No orders of the defined types were placed in this encounter.   Recommend 150 minutes/week of aerobic exercise Low fat, low carb, high fiber diet recommended  Disposition:   FU in 1 year   Signed, Larae Grooms, MD  10/07/2016 10:24 AM    [facility] [address], [place], [place]  [address] Phone: [phone]; Fax: [phone]

## 2016-10-07 ENCOUNTER — Ambulatory Visit (INDEPENDENT_AMBULATORY_CARE_PROVIDER_SITE_OTHER): Payer: Managed Care, Other (non HMO) | Admitting: Interventional Cardiology

## 2016-10-07 ENCOUNTER — Encounter: Payer: Self-pay | Admitting: Interventional Cardiology

## 2016-10-07 VITALS — BP 126/90 | HR 66 | Ht 74.0 in | Wt 222.8 lb

## 2016-10-07 DIAGNOSIS — I491 Atrial premature depolarization: Secondary | ICD-10-CM

## 2016-10-07 DIAGNOSIS — I471 Supraventricular tachycardia: Secondary | ICD-10-CM | POA: Diagnosis not present

## 2016-10-07 NOTE — Patient Instructions (Signed)

## 2016-10-22 ENCOUNTER — Other Ambulatory Visit: Payer: Self-pay | Admitting: Internal Medicine

## 2016-10-22 DIAGNOSIS — I10 Essential (primary) hypertension: Secondary | ICD-10-CM

## 2016-12-23 ENCOUNTER — Other Ambulatory Visit: Payer: Self-pay | Admitting: Internal Medicine

## 2016-12-23 DIAGNOSIS — I10 Essential (primary) hypertension: Secondary | ICD-10-CM

## 2017-01-23 ENCOUNTER — Other Ambulatory Visit: Payer: Self-pay | Admitting: Internal Medicine

## 2017-01-23 DIAGNOSIS — I10 Essential (primary) hypertension: Secondary | ICD-10-CM

## 2017-02-06 ENCOUNTER — Other Ambulatory Visit: Payer: Self-pay | Admitting: Internal Medicine

## 2017-02-06 DIAGNOSIS — I10 Essential (primary) hypertension: Secondary | ICD-10-CM

## 2017-02-06 MED ORDER — AMLODIPINE BESYLATE 5 MG PO TABS: 5.0000 mg | ORAL_TABLET | Freq: Every day | ORAL | 0 refills | Status: DC

## 2017-02-24 ENCOUNTER — Ambulatory Visit: Payer: Self-pay | Admitting: Internal Medicine

## 2017-03-04 NOTE — Progress Notes (Signed)
Subjective:    Patient ID: Daniel Petty, male    DOB: [birth year], 59 y.o.   MRN: [id number]  HPI The patient is here for follow up.  Hypertension: He is taking his medication daily. He is compliant with a low sodium diet.  He denies chest pain, palpitations, edema, shortness of breath and regular headaches. He is exercising regularly.  He does monitor his blood pressure at work and it is always less than 140 - usually in the 120's.    He has gained weight over the holidays and will work on losing it - he knows he should lose about 30 lbs.    Medications and allergies reviewed with patient and updated if appropriate.  Patient Active Problem List   Diagnosis Date Noted  . SVT (supraventricular tachycardia) (HCC) 10/07/2016  . Low testosterone 09/19/2016  . Arthralgia 09/17/2016  . Malaise and fatigue 09/11/2016  . Cough 01/22/2016  . PAC (premature atrial contraction) 09/13/2015  . PVC (premature ventricular contraction) 09/13/2015  . Migraines 05/14/2015  . Osteoarthritis 05/14/2015  . History of cardiac radiofrequency ablation 05/14/2015  . Essential hypertension, benign 05/14/2015    Current Outpatient Medications on File Prior to Visit  Medication Sig Dispense Refill  . amLODipine (NORVASC) 5 MG tablet Take 1 tablet (5 mg total) by mouth daily. 30 tablet 0  . EPINEPHrine 0.3 mg/0.3 mL IJ SOAJ injection Inject 0.3 mLs (0.3 mg total) into the muscle once. 2 Device 3  . SUMAtriptan Succinate (IMITREX PO) Take by mouth daily as needed.     No current facility-administered medications on file prior to visit.     Past Medical History:  Diagnosis Date  . Arthritis   . Hyperlipemia   . Hypertension   . Migraine   . Syncope   . Tachycardia    6 [place] in [place]    Past Surgical History:  Procedure Laterality Date  . APPENDECTOMY    . CHOLECYSTECTOMY    . psvt typical    . TONSILLECTOMY    . typical AVNRT      Social History   Socioeconomic History    . Marital status: Married    Spouse name: None  . Number of children: None  . Years of education: None  . Highest education level: None  Social Needs  . Financial resource strain: None  . Food insecurity - worry: None  . Food insecurity - inability: None  . Transportation needs - medical: None  . Transportation needs - non-medical: None  Occupational History  . None  Tobacco Use  . Smoking status: Never Smoker  . Smokeless tobacco: Never Used  Substance and Sexual Activity  . Alcohol use: Yes  . Drug use: No  . Sexual activity: None  Other Topics Concern  . None  Social History Narrative   Exercise: regular    Family History  Problem Relation Age of Onset  . Hyperlipidemia Mother   . Heart attack Mother   . Multiple sclerosis Mother   . Arrhythmia Mother   . Heart disease Father   . Multiple sclerosis Brother   . Hyperlipidemia Maternal Grandmother     Review of Systems  Constitutional: Negative for chills and fever.  Respiratory: Positive for cough (x 1 week - dry, allergy). Negative for shortness of breath and wheezing.   Cardiovascular: Negative for chest pain, palpitations and leg swelling.  Neurological: Positive for headaches (daily, migraines once a month). Negative for dizziness and light-headedness.  Objective:   Vitals:   03/05/17 0856  BP: (!) 144/90  Pulse: 78  Resp: 16  Temp: 98.6 F (37 C)  SpO2: 97%   Wt Readings from Last 3 Encounters:  03/05/17 232 lb (105.2 kg)  10/07/16 222 lb 12.8 oz (101.1 kg)  09/17/16 222 lb (100.7 kg)   Body mass index is 29.79 kg/m.   Physical Exam    Constitutional: Appears well-developed and well-nourished. No distress.  HENT:  Head: Normocephalic and atraumatic.  Neck: Neck supple. No tracheal deviation present. No thyromegaly present.  No cervical lymphadenopathy Cardiovascular: Normal rate, regular rhythm and normal heart sounds.   No murmur heard. No carotid bruit .  No  edema Pulmonary/Chest: Effort normal and breath sounds normal. No respiratory distress. No has no wheezes. No rales.  Skin: Skin is warm and dry. Not diaphoretic.  Psychiatric: Normal mood and affect. Behavior is normal.      Assessment & Plan:    See Problem List for Assessment and Plan of chronic medical problems.

## 2017-03-05 ENCOUNTER — Encounter: Payer: Self-pay | Admitting: Internal Medicine

## 2017-03-05 ENCOUNTER — Ambulatory Visit: Payer: Managed Care, Other (non HMO) | Admitting: Internal Medicine

## 2017-03-05 VITALS — BP 144/90 | HR 78 | Temp 98.6°F | Resp 16 | Wt 232.0 lb

## 2017-03-05 DIAGNOSIS — I10 Essential (primary) hypertension: Secondary | ICD-10-CM | POA: Diagnosis not present

## 2017-03-05 MED ORDER — AMLODIPINE BESYLATE 5 MG PO TABS: 5.0000 mg | ORAL_TABLET | Freq: Every day | ORAL | 3 refills | Status: DC

## 2017-03-05 NOTE — Assessment & Plan Note (Addendum)
Well controlled - monitors it at work BP well controlled Current regimen effective and well tolerated Continue current medications at current doses cmp up to date and has annual blood work at work Will work on Ray[employer]weight loss - decreasing portions Follow up annually

## 2017-03-05 NOTE — Patient Instructions (Addendum)
  No immunizations administered today.   Medications reviewed and updated.  No changes recommended at this time.  Your prescription(s) have been submitted to your pharmacy. Please take as directed and contact our office if you believe you are having problem(s) with the medication(s).   Please followup in one year    

## 2017-04-29 ENCOUNTER — Encounter: Payer: Self-pay | Admitting: Internal Medicine

## 2017-04-30 ENCOUNTER — Encounter: Payer: Self-pay | Admitting: Family Medicine

## 2017-04-30 ENCOUNTER — Ambulatory Visit (INDEPENDENT_AMBULATORY_CARE_PROVIDER_SITE_OTHER)
Admission: RE | Admit: 2017-04-30 | Discharge: 2017-04-30 | Disposition: A | Discharge status: Home / Self Care | Payer: Managed Care, Other (non HMO) | Source: Ambulatory Visit | Attending: Family Medicine | Admitting: Family Medicine

## 2017-04-30 ENCOUNTER — Ambulatory Visit: Payer: Managed Care, Other (non HMO) | Admitting: Family Medicine

## 2017-04-30 VITALS — BP 142/80 | HR 80 | Temp 98.2°F | Wt 231.0 lb

## 2017-04-30 DIAGNOSIS — R059 Cough, unspecified: Secondary | ICD-10-CM

## 2017-04-30 DIAGNOSIS — R05 Cough: Secondary | ICD-10-CM | POA: Diagnosis not present

## 2017-04-30 DIAGNOSIS — R0982 Postnasal drip: Secondary | ICD-10-CM | POA: Diagnosis not present

## 2017-04-30 MED ORDER — FLUTICASONE PROPIONATE 50 MCG/ACT NA SUSP: 1.0000 | Freq: Every day | NASAL | 0 refills | Status: DC

## 2017-04-30 MED ORDER — BENZONATATE 100 MG PO CAPS: 100.0000 mg | ORAL_CAPSULE | Freq: Two times a day (BID) | ORAL | 0 refills | Status: DC | PRN

## 2017-04-30 MED ORDER — CETIRIZINE HCL 10 MG PO TABS
10.0000 mg | ORAL_TABLET | Freq: Every day | ORAL | 11 refills | Status: DC
Start: 2017-04-30 — End: 2017-06-18

## 2017-04-30 NOTE — Patient Instructions (Signed)
Cough, Adult Coughing is a reflex that clears your throat and your airways. Coughing helps to heal and protect your lungs. It is normal to cough occasionally, but a cough that happens with other symptoms or lasts a long time may be a sign of a condition that needs treatment. A cough may last only 2-3 weeks (acute), or it may last longer than 8 weeks (chronic). What are the causes? Coughing is commonly caused by:  Breathing in substances that irritate your lungs.  A viral or bacterial respiratory infection.  Allergies.  Asthma.  Postnasal drip.  Smoking.  Acid backing up from the stomach into the esophagus (gastroesophageal reflux).  Certain medicines.  Chronic lung problems, including COPD (or rarely, lung cancer).  Other medical conditions such as heart failure.  Follow these instructions at home: Pay attention to any changes in your symptoms. Take these actions to help with your discomfort:  Take medicines only as told by your health care provider. ? If you were prescribed an antibiotic medicine, take it as told by your health care provider. Do not stop taking the antibiotic even if you start to feel better. ? Talk with your health care provider before you take a cough suppressant medicine.  Drink enough fluid to keep your urine clear or pale yellow.  If the air is dry, use a cold steam vaporizer or humidifier in your bedroom or your home to help loosen secretions.  Avoid anything that causes you to cough at work or at home.  If your cough is worse at night, try sleeping in a semi-upright position.  Avoid cigarette smoke. If you smoke, quit smoking. If you need help quitting, ask your health care provider.  Avoid caffeine.  Avoid alcohol.  Rest as needed.  Contact a health care provider if:  You have new symptoms.  You cough up pus.  Your cough does not get better after 2-3 weeks, or your cough gets worse.  You cannot control your cough with suppressant  medicines and you are losing sleep.  You develop pain that is getting worse or pain that is not controlled with pain medicines.  You have a fever.  You have unexplained weight loss.  You have night sweats. Get help right away if:  You cough up blood.  You have difficulty breathing.  Your heartbeat is very fast. This information is not intended to replace advice given to you by your health care provider. Make sure you discuss any questions you have with your health care provider. Document Released: 07/26/2010 Document Revised: 07/05/2015 Document Reviewed: 04/05/2014 Elsevier Interactive Patient Education  2018 Else[employer] Inc[employer]  Postnasal Drip Postnasal drip is the feeling of mucus going down the back of your throat. Mucus is a slimy substance that moistens and cleans your nose and throat, as well as the air pockets in face bones near your forehead and cheeks (sinuses). Small amounts of mucus pass from your nose and sinuses down the back of your throat all the time. This is normal. When you produce too much mucus or the mucus gets too thick, you can feel it. Some common causes of postnasal drip include:  Having more mucus because of: ? A cold or the flu. ? Allergies. ? Cold air. ? Certain medicines.  Having more mucus that is thicker because of: ? A sinus or nasal infection. ? Dry air. ? A food allergy.  Follow these instructions at home: Relieving discomfort  Gargle with a salt-water mixture 3-4 times a day or as   needed. To make a salt-water mixture, completely dissolve -1 tsp of salt in 1 cup of warm water.  If the air in your home is dry, use a humidifier to add moisture to the air.  Use a saline spray or container (neti pot) to flush out the nose (nasal irrigation). These methods can help clear away mucus and keep the nasal passages moist. General instructions  Take over-the-counter and prescription medicines only as told by your health care provider.  Follow  instructions from your health care provider about eating or drinking restrictions. You may need to avoid caffeine.  Avoid things that you know you are allergic to (allergens), like dust, mold, pollen, pets, or certain foods.  Drink enough fluid to keep your urine pale yellow.  Keep all follow-up visits as told by your health care provider. This is important. Contact a health care provider if:  You have a fever.  You have a sore throat.  You have difficulty swallowing.  You have headache.  You have sinus pain.  You have a cough that does not go away.  The mucus from your nose becomes thick and is green or yellow in color.  You have cold or flu symptoms that last more than 10 days. Summary  Postnasal drip is the feeling of mucus going down the back of your throat.  If your health care provider approves, use nasal irrigation or a nasal spray 2?4 times a day.  Avoid things that you know you are allergic to (allergens), like dust, mold, pollen, pets, or certain foods. This information is not intended to replace advice given to you by your health care provider. Make sure you discuss any questions you have with your health care provider. Document Released: 05/12/2016 Document Revised: 05/12/2016 Document Reviewed: 05/12/2016 Elsevier Interactive Patient Education  2018 Elsevier Inc.  

## 2017-04-30 NOTE — Progress Notes (Signed)
Subjective:    Patient ID: Daniel Petty, male    DOB: 07/10/1960, 57 y.o.   MRN: [id number]  No chief complaint on file.   HPI Patient was seen today for ongoing concern.  Patient endorses dry cough starting in December.  2 weeks ago the cough became more productive with yellow-green sputum.  Patient also endorses feeling tired, rhinorrhea, and having irritated throat from coughing.  Patient denies fever, chills.  Patient has taken NyQuil and cough drops for his symptoms.  Patient also takes Allegra but does not feel like it helps.  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  Since that time he has not been exposed to black mold.  Past Medical History:  Diagnosis Date  . Arthritis   . Hyperlipemia   . Hypertension   . Migraine   . Syncope   . Tachycardia    6 [place]    Allergies  Allergen Reactions  . Penicillins Anaphylaxis  . [place] Venom   . Sulfa Antibiotics Rash    ROS General: Denies fever, chills, night sweats, changes in weight, changes in appetite   + fatigue HEENT: Denies headaches, ear pain, changes in vision, sore throat    + rhinorrhea CV: Denies CP, palpitations, SOB, orthopnea Pulm: Denies SOB,  Wheezing  + cough GI: Denies abdominal pain, nausea, vomiting, diarrhea, constipation GU: Denies dysuria, hematuria, frequency, vaginal discharge Msk: Denies muscle cramps, joint pains Neuro: Denies weakness, numbness, tingling Skin: Denies rashes, bruising Psych: Denies depression, anxiety, hallucinations     Objective:    Blood pressure (!) 142/80, pulse 80, temperature 98.2 F (36.8 C), temperature source Oral, weight 231 lb (104.8 kg), SpO2 98 %.   Gen. Pleasant, well-nourished, in no distress, normal affect  HEENT: [place]/AT, face symmetric,no scleral icterus, PERRLA, nares patent without drainage, pharynx with mild erythema and postnasal drainage, no  exudate.  TMs full bilaterally.  No cervical lymphadenopathy. Lungs: no accessory muscle use, CTAB, no wheezes or rales Cardiovascular: RRR, no m/r/g, no peripheral edema Abdomen: BS present, soft, NT/ND Neuro:  A&Ox3, CN II-XII intact, normal gait   Wt Readings from Last 3 Encounters:  03/05/17 232 lb (105.2 kg)  10/07/16 222 lb 12.8 oz (101.1 kg)  09/17/16 222 lb (100.7 kg)    Lab Results  Component Value Date   WBC 7.8 09/11/2016   HGB 14.8 09/11/2016   HCT 44.2 09/11/2016   PLT 275.0 09/11/2016   GLUCOSE 87 09/11/2016   ALT 12 09/11/2016   AST 12 09/11/2016   NA 138 09/11/2016   K 4.6 09/11/2016   CL 104 09/11/2016   CREATININE 0.72 09/11/2016   BUN 11 09/11/2016   CO2 29 09/11/2016   TSH 1.92 09/11/2016    Assessment/Plan:  Cough  -Discussed possible causes of cough including allergies, bronchitis, postnasal drainage, pneumonia. -Supportive care -We will proceed with chest x-ray - Plan: fluticasone (FLONASE) 50 MCG/ACT nasal spray, benzonatate (TESSALON) 100 MG capsule, DG Chest 2 View  Post-nasal drainage  - Plan: fluticasone (FLONASE) 50 MCG/ACT nasal spray, cetirizine (ZYRTEC) 10 MG tablet  Follow-up PRN with PCP  Abbe AmsterdamShannon Keleigh Kazee, MD

## 2017-05-26 ENCOUNTER — Encounter: Payer: Self-pay | Admitting: Internal Medicine

## 2017-05-26 DIAGNOSIS — R053 Chronic cough: Secondary | ICD-10-CM

## 2017-05-26 DIAGNOSIS — R05 Cough: Secondary | ICD-10-CM

## 2017-05-26 NOTE — Telephone Encounter (Signed)
Last RX was 1-2 puffs every 6 hours

## 2017-05-27 ENCOUNTER — Other Ambulatory Visit: Payer: Self-pay | Admitting: Family Medicine

## 2017-05-27 DIAGNOSIS — R0982 Postnasal drip: Secondary | ICD-10-CM

## 2017-05-27 DIAGNOSIS — R059 Cough, unspecified: Secondary | ICD-10-CM

## 2017-05-27 DIAGNOSIS — R05 Cough: Secondary | ICD-10-CM

## 2017-05-28 NOTE — Addendum Note (Signed)
Addended by: Pincus SanesBURNS, Fatim Vanderschaaf J on: 05/28/2017 08:08 AM   Modules accepted: Orders

## 2017-06-18 ENCOUNTER — Encounter: Payer: Self-pay | Admitting: Internal Medicine

## 2017-06-18 ENCOUNTER — Ambulatory Visit: Payer: Managed Care, Other (non HMO) | Admitting: Internal Medicine

## 2017-06-18 ENCOUNTER — Other Ambulatory Visit (INDEPENDENT_AMBULATORY_CARE_PROVIDER_SITE_OTHER): Payer: Managed Care, Other (non HMO)

## 2017-06-18 VITALS — BP 140/90 | HR 71 | Ht 74.0 in | Wt 229.4 lb

## 2017-06-18 DIAGNOSIS — J45991 Cough variant asthma: Secondary | ICD-10-CM

## 2017-06-18 LAB — CBC WITH DIFFERENTIAL/PLATELET
Basophils Absolute: 0 10*3/uL (ref 0.0–0.1)
Basophils Relative: 0.5 % (ref 0.0–3.0)
Eosinophils Absolute: 0.2 10*3/uL (ref 0.0–0.7)
Eosinophils Relative: 2.2 % (ref 0.0–5.0)
HEMATOCRIT: 43.4 % (ref 39.0–52.0)
HEMOGLOBIN: 14.6 g/dL (ref 13.0–17.0)
LYMPHS PCT: 28.8 % (ref 12.0–46.0)
Lymphs Abs: 2.5 10*3/uL (ref 0.7–4.0)
MCHC: 33.7 g/dL (ref 30.0–36.0)
MCV: 90 fl (ref 78.0–100.0)
MONO ABS: 0.7 10*3/uL (ref 0.1–1.0)
Monocytes Relative: 7.6 % (ref 3.0–12.0)
Neutro Abs: 5.2 10*3/uL (ref 1.4–7.7)
Neutrophils Relative %: 60.9 % (ref 43.0–77.0)
Platelets: 262 10*3/uL (ref 150.0–400.0)
RBC: 4.82 Mil/uL (ref 4.22–5.81)
RDW: 14.3 % (ref 11.5–15.5)
WBC: 8.6 10*3/uL (ref 4.0–10.5)

## 2017-06-18 LAB — NITRIC OXIDE: Nitric Oxide: 9

## 2017-06-18 MED ORDER — PREDNISONE 10 MG PO TABS: ORAL_TABLET | ORAL | 0 refills | Status: DC

## 2017-06-18 MED ORDER — MOMETASONE FURO-FORMOTEROL FUM 100-5 MCG/ACT IN AERO: 2.0000 | INHALATION_SPRAY | Freq: Two times a day (BID) | RESPIRATORY_TRACT | 11 refills | Status: DC

## 2017-06-18 NOTE — Progress Notes (Signed)
Subjective:     Patient ID: Robet Crutchfield, male   DOB: [birth year],    MRN: [id number]  HPI  34 yowm never smoker grew [place] with bad hayfever (hay season always made nose run/itchy eye) ages [age] and some better p that because moved away form [place] to college and never went back then [place] to [place] 2014 [place] with onset shortly thereafter of seasonal cough starting late Feb - May rhinitis/dry cough no better on flonase, clariton , tessalon, zyrtec, nasonex omeprazole  all "worthless" per pt then rx   singulair  Which was started end of April 2019 and has  worked the best along with albuterol which helps temporarily so referred to pulmonary clinic 06/18/2017 by Dr   Cheryll Cockayne   06/18/2017 1st [facility] office visit/ Herron Fero   Chief Complaint  Patient presents with  . Pulmonary Consult    Referred by Dr. Lawerance Bach. Pt c/o cough x 5 months. Cough is non prod and not worse at any time of day.    flares of cough each late winter early spring x 2014 x for this episode onset early Jan 2019 and continues daily not usually  Noct >>  cough starts p stirring def worse with voice use, never noct at all  No longer on omeprazole  And never took pepcid   Not limited by breathing from desired activities    No obvious day to day or daytime variability or assoc excess/ purulent sputum or mucus plugs or hemoptysis or cp or chest tightness, subjective wheeze or overt sinus or hb symptoms. No unusual exposure hx or h/o childhood pna/ asthma or knowledge of premature birth.  Sleeping  Fine most nocts  without nocturnal  or early am exacerbation  of respiratory  c/o's or need for noct saba. Also denies any obvious fluctuation of symptoms with weather or environmental changes or other aggravating or alleviating factors except as outlined above   Current Allergies, Complete Past Medical History, Past Surgical History, Family History, and Social History were reviewed in [facility] record.  ROS  The following are not active complaints unless bolded Hoarseness, sore throat, dysphagia, dental problems, itching, sneezing,  nasal congestion or discharge of excess mucus or purulent secretions, ear ache,   fever, chills, sweats, unintended wt loss or wt gain, classically pleuritic or exertional cp,  orthopnea pnd or arm/hand swelling  or leg swelling, presyncope, palpitations, abdominal pain, anorexia, nausea, vomiting, diarrhea  or change in bowel habits or change in bladder habits, change in stools or change in urine, dysuria, hematuria,  rash, arthralgias, visual complaints, headache, numbness, weakness or ataxia or problems with walking or coordination,  change in mood or  memory.        Current Meds  Medication Sig  . amLODipine (NORVASC) 5 MG tablet Take 1 tablet (5 mg total) by mouth daily.  . clomiPHENE (CLOMID) 50 MG tablet Take 50 mg by mouth.  . EPINEPHrine 0.3 mg/0.3 mL IJ SOAJ injection Inject 0.3 mLs (0.3 mg total) into the muscle once.  . montelukast (SINGULAIR) 10 MG tablet Take 10 mg by mouth at bedtime.  . SUMAtriptan Succinate (IMITREX PO) Take by mouth daily as needed.       Review of Systems     Objective:   Physical Exam amb wm   Wt Readings from Last 3 Encounters:  06/18/17 229 lb 6.4 oz (104.1 kg)  04/30/17 231 lb (104.8 kg)  03/05/17  232 lb (105.2 kg)     Vital signs reviewed - Note on arrival 02 sats  97% on RA     HEENT: nl dentition, turbinates bilaterally, and oropharynx. Nl external ear canals without cough reflex   NECK :  without JVD/Nodes/TM/ nl carotid upstrokes bilaterally   LUNGS: no acc muscle use,  Nl contour chest which is clear to A and P bilaterally without cough on insp or exp maneuvers   CV:  RRR  no s3 or murmur or increase in P2, and no edema   ABD:  soft and nontender with nl inspiratory excursion in the supine position. No bruits or organomegaly appreciated, bowel sounds nl  MS:  Nl gait/  ext warm without deformities, calf tenderness, cyanosis or clubbing No obvious joint restrictions   SKIN: warm and dry without lesions    NEURO:  alert, approp, nl sensorium with  no motor or cerebellar deficits apparent.      I personally reviewed images and agree with radiology impression as follows:  CXR:   04/30/17 1. No acute abnormality. 2. Stable mild bronchitic changes.  Labs ordered 06/18/2017  Allergy profile     Assessment:

## 2017-06-18 NOTE — Patient Instructions (Addendum)
Continue singulair and add dulera 100 Take 2 puffs first thing in am and then another 2 puffs about 12 hours later - don't fill the prescription if you don't think it works '  Work on inhaler technique:  relax and gently blow all the way out then take a nice smooth deep breath back in, triggering the inhaler at same time you start breathing in.  Hold for up to 5 seconds if you can. Blow out thru nose. Rinse and gargle with water when done    GERD (REFLUX)  is an extremely common cause of respiratory symptoms just like yours , many times with no obvious heartburn at all.    It can be treated with medication, but also with lifestyle changes including elevation of the head of your bed (ideally with 6 inch  bed blocks),  Smoking cessation, avoidance of late meals, excessive alcohol, and avoid fatty foods, chocolate, peppermint, colas, red wine, and acidic juices such as orange juice.  NO MINT OR MENTHOL PRODUCTS SO NO COUGH DROPS   USE SUGARLESS CANDY INSTEAD (Jolley ranchers or Stover's or Life Savers) or even ice chips will also do - the key is to swallow to prevent all throat clearing. NO OIL BASED VITAMINS - use powdered substitutes.   If all else fails, take Prednisone 10 mg take  4 each am x 2 days,   2 each am x 2 days,  1 each am x 2 days and stop    Please remember to go to the lab department downstairs in the basement  for your tests - we will call you with the results when they are available.     Please schedule a follow up office visit in 6 weeks, call sooner if needed

## 2017-06-19 ENCOUNTER — Encounter: Payer: Self-pay | Admitting: Internal Medicine

## 2017-06-19 LAB — RESPIRATORY ALLERGY PROFILE REGION II ~~LOC~~
Allergen, A. alternata, m6: 0.16 kU/L — ABNORMAL HIGH
Allergen, Comm Silver Birch, t9: <0.1 kU/L
Allergen, Cottonwood, t14: <0.1 kU/L
Allergen, D pternoyssinus,d7: <0.1 kU/L
Allergen, Mouse Urine Protein, e78: <0.1 kU/L
Allergen, P. notatum, m1: <0.1 kU/L
Aspergillus fumigatus, m3: 0.38 kU/L — ABNORMAL HIGH
Bermuda Grass: <0.1 kU/L
Box Elder IgE: <0.1 kU/L
CLASS: 0
CLASS: 0
CLASS: 0
CLASS: 0
CLASS: 0
CLASS: 0
CLASS: 0
CLASS: 0
CLASS: 0
CLASS: 1
COCKROACH: 0.14 kU/L — AB
COMMON RAGWEED (SHORT) (W1) IGE: <0.1 kU/L
Class: 0
Class: 0
Class: 0
Class: 0
Class: 0
Class: 0
Class: 0
Class: 0
Class: 0
Class: 0
Class: 0
Class: 0
Class: 0
Class: 0
D. farinae: <0.1 kU/L
Dog Dander: <0.1 kU/L
Elm IgE: <0.1 kU/L
IgE (Immunoglobulin E), Serum: 38 kU/L (ref <=114)
Johnson Grass: <0.1 kU/L
Sheep Sorrel IgE: <0.1 kU/L

## 2017-06-19 LAB — INTERPRETATION:

## 2017-06-19 NOTE — Progress Notes (Signed)
LMTCB

## 2017-06-19 NOTE — Assessment & Plan Note (Addendum)
FENO 06/18/2017  =   9 on singulair but still coughing  - 06/18/2017  After extensive coaching inhaler device  effectiveness =    90% try dulera 100 2bid x 2 week sample - Allergy profile 06/18/2017 >  Eos 0.2 /  IgE  Pending   The most common causes of chronic cough in immunocompetent adults include the following: upper airway cough syndrome (UACS), previously referred to as postnasal drip syndrome (PNDS), which is caused by variety of rhinosinus conditions; (2) asthma; (3) GERD; (4) chronic bronchitis from cigarette smoking or other inhaled environmental irritants; (5) nonasthmatic eosinophilic bronchitis; and (6) bronchiectasis.   These conditions, singly or in combination, have accounted for up to 94% of the causes of chronic cough in prospective studies.   Other conditions have constituted no >6% of the causes in prospective studies These have included bronchogenic carcinoma, chronic interstitial pneumonia, sarcoidosis, left ventricular failure, ACEI-induced cough, and aspiration from a condition associated with pharyngeal dysfunction.    Chronic cough is often simultaneously caused by more than one condition. A single cause has been found from 38 to 82% of the time, multiple causes from 18 to 62%. Multiply caused cough has been the result of three diseases up to 42% of the time.       Mostly likely this is cough variant asthma vs uacs from pnds/allergic rhinitis that has responded partially to singulair.  To sort out rec trial of dulera 100 2bid and if eliminates the cough then the dx is fairly firm, and if not then need to direct attention back to the upper airway and eval for sinustis/gerd (diet given today and reviewed.  If all else fails he can take a 6 day course of prednisone and regardless need to return to regroup/ assess response in  6 weeks noting that according to his time table of yearly variation, we may have run out the clock with this episode to assess response to empirical rx    Reviewed with pt: The standardized cough guidelines published in Chest by [place] in 2006 are still the best available and consist of a multiple step process (up to 12!) , not a single office visit,  and are intended  to address this problem logically,  with an alogrithm dependent on response to empiric treatment at  each progressive step  to determine a specific diagnosis with  minimal addtional testing needed. Therefore if adherence is an issue or can't be accurately verified,  it's very unlikely the standard evaluation and treatment will be successful here.    Furthermore, response to therapy (other than acute cough suppression, which should only be used short term with avoidance of narcotic containing cough syrups if possible), can be a gradual process for which the patient is not likely to  perceive immediate benefit.  Unlike going to an eye doctor where the best perscription is almost always the first one and is immediately effective, this is almost never the case in the management of chronic cough syndromes. Therefore the patient needs to commit up front to consistently adhere to recommendations  for up to 6 weeks of therapy directed at the likely underlying problem(s) before the response can be reasonably evaluated.    Total time devoted to counseling  > 50 % of initial 60 min office visit:  review case with pt/ discussion of options/alternatives/ personally creating written customized instructions  in presence of pt  then going over those specific  Instructions directly with the pt including how to  use all of the meds but in particular covering each new medication in detail and the difference between the maintenance= "automatic" meds and the prns using an action plan format for the latter (If this problem/symptom => do that organization reading Left to right).  Please see AVS from this visit for a full list of these instructions which I personally wrote for this pt and  are unique to this  visit.   See device teaching which extended face to face time for this visit

## 2017-06-23 NOTE — Progress Notes (Signed)
Left detailed msg ok per DPR

## 2017-07-30 ENCOUNTER — Ambulatory Visit: Payer: Managed Care, Other (non HMO) | Admitting: Internal Medicine

## 2018-01-11 ENCOUNTER — Encounter: Payer: Self-pay | Admitting: Physician Assistant

## 2018-01-11 DIAGNOSIS — I77810 Thoracic aortic ectasia: Secondary | ICD-10-CM | POA: Insufficient documentation

## 2018-01-11 NOTE — Progress Notes (Addendum)
Cardiology Office Note    Date:  01/12/2018  ID:  Daniel Petty, DOB 04-16-1960, MRN [id number] PCP:  Pincus Sanes, MD  Cardiologist:  Lance Muss, MD   Chief Complaint: 1 year f/u SVT  History of Present Illness:  Daniel Petty is a 57 y.o. 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 2D echo in 2015 showed EF 60%, possible abnormal LV relaxation but normal by doppler crtieria, mild LAE, AV sclerosis, trace-mild AI, mild MR, trace TR, dilated mid ascending aorta 4.52cm. ETT 2015 was normal, 11 METS. Last labs 06/2017 normal CBC, 09/2016 CMET normal Cr 0.72, TSH wnl, no recent lipids.  He presents for routine follow-up feeling great. He believes he had a brief episode of SVT 6 months ago that responded to blowing out his nose against pressure. He has had rare skips in his heartbeat since childhood (ectopy on 2015 monitor), but no acute change recently. No CP or SOB. He remains quite active. [place] from [place]. His lipids are followed by PCP and he typically gets them screened at work for review by primary care.  Past Medical History:  Diagnosis Date  . Arthritis   . Ascending aorta dilation (HCC)   . Hyperlipemia   . Hypertension   . Low testosterone   . Migraine   . Premature atrial contractions   . PSVT (paroxysmal supraventricular tachycardia) (HCC)    a. syncope/HR 200s -> SVT/AVNRT s/p ablation 2015.  Marland Kitchen PVC's (premature ventricular contractions)   . Syncope   . Tachycardia    6 years ago while in the [place]    Past Surgical History:  Procedure Laterality Date  . APPENDECTOMY    . CHOLECYSTECTOMY    . psvt typical    . TONSILLECTOMY    . typical AVNRT      Current Medications: Current Meds  Medication Sig  . amLODipine (NORVASC) 5 MG tablet Take 1 tablet (5 mg total) by mouth  daily.  Marland Kitchen EPINEPHrine 0.3 mg/0.3 mL IJ SOAJ injection Inject 0.3 mLs (0.3 mg total) into the muscle once.  . SUMAtriptan Succinate (IMITREX PO) Take by mouth daily as needed.      Allergies:   Penicillins; Yellow jacket venom; and Sulfa antibiotics   Social History   Socioeconomic History  . Marital status: Married    Spouse name: Not on file  . Number of children: Not on file  . Years of education: Not on file  . Highest education level: Not on file  Occupational History  . Not on file  Social Needs  . Financial resource strain: Not on file  . Food insecurity:    Worry: Not on file    Inability: Not on file  . Transportation needs:    Medical: Not on file    Non-medical: Not on file  Tobacco Use  . Smoking status: Never Smoker  . Smokeless tobacco: Never Used  Substance and Sexual Activity  . Alcohol use: Yes  . Drug use: No  . Sexual activity: Not on file  Lifestyle  . Physical activity:    Days per week: Not on file    Minutes per session: Not on file  . Stress: Not on file  Relationships  . Social connections:    Talks on phone: Not on file    Gets together: Not on file  Attends religious service: Not on file    Active member of club or organization: Not on file    Attends meetings of clubs or organizations: Not on file    Relationship status: Not on file  Other Topics Concern  . Not on file  Social History Narrative   Exercise: regular     Family History:  The patient's family history includes Allergies in his father and mother; Arrhythmia in his mother; Heart attack in his mother; Heart disease in his father; Hyperlipidemia in his maternal grandmother and mother; Multiple sclerosis in his brother and mother.  ROS:   Please see the history of present illness.  All other systems are reviewed and otherwise negative.    PHYSICAL EXAM:   VS:  BP 130/80   Pulse 66   Ht 6\' 2"  (1.88 m)   Wt 223 lb 12.8 oz (101.5 kg)   SpO2 98%   BMI 28.73 kg/m   BMI:  Body mass index is 28.73 kg/m. GEN: Well nourished, well developed WM, in no acute distress HEENT: normocephalic, atraumatic Neck: no JVD, carotid bruits, or masses Cardiac: RRR; no murmurs, rubs, or gallops, no edema  Respiratory:  clear to auscultation bilaterally, normal work of breathing GI: soft, nontender, nondistended, + BS MS: no deformity or atrophy Skin: warm and dry, no rash Neuro:  Alert and Oriented x 3, Strength and sensation are intact, follows commands Psych: euthymic mood, full affect  Wt Readings from Last 3 Encounters:  01/12/18 223 lb 12.8 oz (101.5 kg)  06/18/17 229 lb 6.4 oz (104.1 kg)  04/30/17 231 lb (104.8 kg)      Studies/Labs Reviewed:   EKG:  EKG was ordered today and personally reviewed by me and demonstrates NSR 66bpm no acute changes, normal QTc 427ms  Recent Labs: 06/18/2017: Hemoglobin 14.6; Platelets 262.0   Lipid Panel No results found for: CHOL, TRIG, HDL, CHOLHDL, VLDL, LDLCALC, LDLDIRECT  Additional studies/ records that were reviewed today include: Summarized above   ASSESSMENT & PLAN:   1. PSVT - well controlled following ablation. He had 1 brief episode 6 months ago but this was the only significant recurrence. Continue to observe. Discussed Lourena SimmondsKardia as a way of monitoring in the future. He will inform us if symptoms re-emerge. 2. PACs/PVCs - very rare symptoms of ectopy; no current indication for therapy. He will inform us if symptoms become more frequent. 3. Dilated ascending aorta - noted on echo in 2015. Will evaluate further with CT angio. Get pre-CT BMET. Discussed aneurysm precautions of BP control, relative screening, avoiding heavy lifting. 4. Essential HTN - BP upper limits of normal today. He follows this at home and states it is lower at home. He will continue to monitor. No side effects on amlodipine.  Disposition: F/u with Dr. Eldridge DaceVaranasi in 1 year.  Medication Adjustments/Labs and Tests Ordered: Current medicines are  reviewed at length with the patient today.  Concerns regarding medicines are outlined above. Medication changes, Labs and Tests ordered today are summarized above and listed in the Patient Instructions accessible in Encounters.   Signed, Laurann Montanaayna N Akiva Josey, PA-C  01/12/2018 8:15 AM    [facility] [address], [place], [place]  [address] Phone: [phone]; Fax: [phone]

## 2018-01-12 ENCOUNTER — Encounter: Payer: Self-pay | Admitting: Physician Assistant

## 2018-01-12 ENCOUNTER — Ambulatory Visit: Payer: Managed Care, Other (non HMO) | Admitting: Physician Assistant

## 2018-01-12 ENCOUNTER — Telehealth: Payer: Self-pay | Admitting: *Deleted

## 2018-01-12 VITALS — BP 130/80 | HR 66 | Ht 74.0 in | Wt 223.8 lb

## 2018-01-12 DIAGNOSIS — I77810 Thoracic aortic ectasia: Secondary | ICD-10-CM

## 2018-01-12 DIAGNOSIS — I471 Supraventricular tachycardia: Secondary | ICD-10-CM

## 2018-01-12 DIAGNOSIS — I712 Thoracic aortic aneurysm, without rupture, unspecified: Secondary | ICD-10-CM

## 2018-01-12 DIAGNOSIS — I10 Essential (primary) hypertension: Secondary | ICD-10-CM

## 2018-01-12 DIAGNOSIS — I491 Atrial premature depolarization: Secondary | ICD-10-CM | POA: Diagnosis not present

## 2018-01-12 DIAGNOSIS — I493 Ventricular premature depolarization: Secondary | ICD-10-CM

## 2018-01-12 LAB — BASIC METABOLIC PANEL
BUN / CREAT RATIO: 13 (ref 9–20)
BUN: 11 mg/dL (ref 6–24)
CHLORIDE: 104 mmol/L (ref 96–106)
CO2: 23 mmol/L (ref 20–29)
Calcium: 9.2 mg/dL (ref 8.7–10.2)
Creatinine, Ser: 0.83 mg/dL (ref 0.76–1.27)
GFR calc non Af Amer: 96 mL/min/{1.73_m2} (ref >59)
GFR, EST AFRICAN AMERICAN: 111 mL/min/{1.73_m2} (ref >59)
Glucose: 96 mg/dL (ref 65–99)
POTASSIUM: 5 mmol/L (ref 3.5–5.2)
Sodium: 142 mmol/L (ref 134–144)

## 2018-01-12 NOTE — Telephone Encounter (Signed)
Pt has been made aware of his lab results and he verbalized understanding.  

## 2018-01-12 NOTE — Patient Instructions (Addendum)
Medication Instructions:  Your physician recommends that you continue on your current medications as directed. Please refer to the Current Medication list given to you today.  If you need a refill on your cardiac medications before your next appointment, please call your pharmacy.   Lab work: TODAY:  BMET  If you have labs (blood work) drawn today and your tests are completely normal, you will receive your results only by: Marland Kitchen. MyChart Message (if you have MyChart) OR . A paper copy in the mail If you have any lab test that is abnormal or we need to change your treatment, we will call you to review the results.  Testing/Procedures: Non-Cardiac CT Angiography (CTA), is a special type of CT scan that uses a computer to produce multi-dimensional views of major blood vessels throughout the body. In CT angiography, a contrast material is injected through an IV to help visualize the blood vessels   Follow-Up: At [facility], you and your health needs are our priority.  As part of our continuing mission to provide you with exceptional heart care, we have created designated Provider Care Teams.  These Care Teams include your primary Cardiologist (physician) and Advanced Practice Providers (APPs -  Physician Assistants and Nurse Practitioners) who all work together to provide you with the care you need, when you need it. You will need a follow up appointment in 1 years.  Please call our office 2 months in advance to schedule this appointment.  You may see Lance MussJayadeep Varanasi, MD or one of the following Advanced Practice Providers on your designated Care Team:   ForklandBrittainy Simmons, PA-C Ronie Spiesayna Dunn, PA-C . Jacolyn ReedyMichele Lenze, PA-C  Any Other Special Instructions Will Be Listed Below (If Applicable).  CT Angiogram A CT angiogram is a procedure to look at the blood vessels in various areas of the body. For this procedure, a large X-ray machine, called a CT scanner, takes detailed pictures of blood vessels that  have been injected with a dye (contrast material). A CT angiogram allows your health care provider to see how well blood is flowing to the area of your body that is being checked. Your health care provider will be able to see if there are any problems, such as a blockage. Tell a health care provider about:  Any allergies you have.  All medicines you are taking, including vitamins, herbs, eye drops, creams, and over-the-counter medicines.  Any problems you or family members have had with anesthetic medicines.  Any blood disorders you have.  Any surgeries you have had.  Any medical conditions you have.  Whether you are pregnant or may be pregnant.  Whether you are breastfeeding.  Any anxiety disorders, chronic pain, or other conditions you have that may increase your stress or prevent you from lying still. What are the risks? Generally, this is a safe procedure. However, problems may occur, including:  Infection.  Bleeding.  Allergic reactions to medicines or dyes.  Damage to other structures or organs.  Kidney damage from the dye or contrast that is used.  Increased risk of cancer from radiation exposure. This risk is low. Talk with your health care provider about: ? The risks and benefits of testing. ? How you can receive the lowest dose of radiation.  What happens before the procedure?  Wear comfortable clothing and remove any jewelry.  Follow instructions from your health care provider about eating and drinking. For most people, instructions may include these actions: ? For 12 hours before the test, avoid  caffeine. This includes tea, coffee, soda, and energy drinks or pills. ? For 3-4 hours before the test, stop eating or drinking anything but water. ? Stay well hydrated by continuing to drink water before the exam. This will help to clear the contrast dye from your body after the test.  Ask your health care provider about changing or stopping your regular medicines.  This is especially important if you are taking diabetes medicines or blood thinners. What happens during the procedure?  An IV tube will be inserted into one of your veins.  You will be asked to lie on an exam table. This table will slide in and out of the CT machine during the procedure.  Contrast dye will be injected into the IV tube. You might feel warm, or you may get a metallic taste in your mouth.  The table that you are lying on will move into the CT machine tunnel for the scan.  The person running the machine will give you instructions while the scans are being done. You may be asked to: ? Keep your arms above your head. ? Hold your breath. ? Stay very still, even if the table is moving.  When the scanning is complete, you will be moved out of the machine.  The IV tube will be removed. The procedure may vary among health care providers and hospitals. What happens after the procedure?  You might feel warm, or you may get a metallic taste in your mouth.  You may be asked to drink water or other fluids to wash (flush) the contrast material out of your body.  It is up to you to get the results of your procedure. Ask your health care provider, or the department that is doing the procedure, when your results will be ready. Summary  A CT angiogram is a procedure to look at the blood vessels in various areas of the body.  You will need to stay very still during the exam.  You may be asked to drink water or other fluids to wash (flush) the contrast material out of your body after your scan. This information is not intended to replace advice given to you by your health care provider. Make sure you discuss any questions you have with your health care provider. Document Released: 09/27/2015 Document Revised: 09/27/2015 Document Reviewed: 09/27/2015 Elsevier Interactive Patient Education  2018 [employer].           Mainstays of therapy for dilated aortas include good  blood pressure control, healthy lifestyle, and avoiding fluoroquinolone antibiotic medications (such as those in the "Cipro" class, ending in "floxacin") due to risk of damage to the aorta. This is a finding I would expect to be monitored periodically by their primary cardiologist. Since aneurysms can run in families, you should discuss your diagnosis with first degree relatives as they may need to be screened for this. Regular mild-moderate physical exercise is OK but avoid heavy lifting/weight lifting over 30lbs, chopping wood, shoveling snow or digging heavy earth with a shovel.

## 2018-01-28 ENCOUNTER — Ambulatory Visit (INDEPENDENT_AMBULATORY_CARE_PROVIDER_SITE_OTHER)
Admission: RE | Admit: 2018-01-28 | Discharge: 2018-01-28 | Disposition: A | Discharge status: Home / Self Care | Payer: Managed Care, Other (non HMO) | Source: Ambulatory Visit | Attending: Physician Assistant | Admitting: Physician Assistant

## 2018-01-28 DIAGNOSIS — I712 Thoracic aortic aneurysm, without rupture, unspecified: Secondary | ICD-10-CM

## 2018-01-28 MED ORDER — IOPAMIDOL (ISOVUE-370) INJECTION 76%
100.0000 mL | Freq: Once | INTRAVENOUS | Status: AC | PRN
  Administered 2018-01-28: 100 mL via INTRAVENOUS

## 2018-01-29 ENCOUNTER — Telehealth: Payer: Self-pay | Admitting: *Deleted

## 2018-01-29 DIAGNOSIS — I712 Thoracic aortic aneurysm, without rupture, unspecified: Secondary | ICD-10-CM

## 2018-01-29 NOTE — Telephone Encounter (Signed)
Called pt re: CT Results, left a message for pt to call back. 

## 2018-01-29 NOTE — Telephone Encounter (Signed)
-----   Message from Laurann Montanaayna N Dunn, [place] sent at 01/28/2018  5:16 PM EST ----- Victorino DikeJennifer, please call patient to relay result. This CT was done to f/u echo from 2015 that showed 4.5cm thoracic aortic aneurysm. The aneurysm measures 4.8cm. Some progression is not totally unusual but he's reached the point where he would benefit from seeing one of our cardiothoracic surgeons to follow this more closely, likely with 6 month intervals instead. We had lengthy discussion last visit about aneurysm precautions. One particular class of medicines, beta blockers, have been shown to help slow progression. SBP was 130 at lat visit so I would advise starting Toprol 25mg  daily. Would recommend he check BP/HR for a few days after initiation and send in readings.  His CT scan also showed 2 other findings: - small pulmonary nodule. These are VERY common incidental findings. Some studies quote that up to 95% of pulm nodules are totally benign. The radiologist indicates based on appearance if he is felt to be low risk that he may not need any follow-up, but if high risk, then can consider repeat study in 12 months. He should make sure his PCP is aware of this finding but I anticipate he will get serial CT scanning for his aneurysm which may suffice for serial monitoring.  - coronary artery calcification -this does not automatically mean blockage causing a problem, but does mean we have to pay close attention for development of symptoms and risk factor modification is extremely important. His PCP follows his lipids and so he should discuss with them about going on cholesterol medicine (statin) to make sure disease remains stable. Goal bad cholesterol (LDL) is now much stricter than those without coronary calcification, should be <70. He isn't having any chest pain or SOB but should monitor for such and let us know if any occurs.   I do not necessarily think we need any further testing for the coronary calcifications right now but I  will CC to Dr. Eldridge DaceVaranasi for input. Patient is very active otherwise.  Dayna Dunn PA-C

## 2018-02-04 NOTE — Telephone Encounter (Signed)
2nd attempt to reach pt re: CT results, left a message for pt to call back.

## 2018-02-05 MED ORDER — METOPROLOL SUCCINATE ER 25 MG PO TB24: 25.0000 mg | ORAL_TABLET | Freq: Every day | ORAL | 3 refills | Status: DC

## 2018-02-05 NOTE — Telephone Encounter (Signed)
-----   Message from Dayna N Dunn, PA-C sent at 01/28/2018  5:16 PM EST ----- Earmon Sherrow, please call patient to relay result. This CT was done to f/u echo from 2015 that showed 4.5cm thoracic aortic aneurysm. The aneurysm measures 4.8cm. Some progression is not totally unusual but he's reached the point where he would benefit from seeing one of our cardiothoracic surgeons to follow this more closely, likely with 6 month intervals instead. We had lengthy discussion last visit about aneurysm precautions. One particular class of medicines, beta blockers, have been shown to help slow progression. SBP was 130 at lat visit so I would advise starting Toprol 25mg daily. Would recommend he check BP/HR for a few days after initiation and send in readings.  His CT scan also showed 2 other findings: - small pulmonary nodule. These are VERY common incidental findings. Some studies quote that up to 95% of pulm nodules are totally benign. The radiologist indicates based on appearance if he is felt to be low risk that he may not need any follow-up, but if high risk, then can consider repeat study in 12 months. He should make sure his PCP is aware of this finding but I anticipate he will get serial CT scanning for his aneurysm which may suffice for serial monitoring.  - coronary artery calcification -this does not automatically mean blockage causing a problem, but does mean we have to pay close attention for development of symptoms and risk factor modification is extremely important. His PCP follows his lipids and so he should discuss with them about going on cholesterol medicine (statin) to make sure disease remains stable. Goal bad cholesterol (LDL) is now much stricter than those without coronary calcification, should be <70. He isn't having any chest pain or SOB but should monitor for such and let us know if any occurs.   I do not necessarily think we need any further testing for the coronary calcifications right now but I  will CC to Dr. Varanasi for input. Patient is very active otherwise.  Dayna Dunn PA-C  

## 2018-02-05 NOTE — Telephone Encounter (Signed)
Follow up     Pt returning call and has questions about  ct scan results

## 2018-02-08 ENCOUNTER — Telehealth: Payer: Self-pay | Admitting: Interventional Cardiology

## 2018-02-08 NOTE — Telephone Encounter (Signed)
Spoke with pt and made him aware ok to take both medications.  Advised to monitor BP to make sure it is not dropping too low.  Pt appreciative for call.

## 2018-02-08 NOTE — Telephone Encounter (Signed)
  Pt c/o medication issue:  1. Name of Medication: metoprolol succinate (TOPROL XL) 25 MG 24 hr tablet  2. How are you currently taking this medication (dosage and times per day)? Take 1 tablet (25 mg total) by mouth daily  3. Are you having a reaction (difficulty breathing--STAT)?  No  4. What is your medication issue? Patient would like to make sure that it is safe to take metoprolol with amLODipine (NORVASC) 5 MG tablet before he starts it.

## 2018-02-17 ENCOUNTER — Ambulatory Visit (INDEPENDENT_AMBULATORY_CARE_PROVIDER_SITE_OTHER): Payer: Managed Care, Other (non HMO) | Admitting: Internal Medicine

## 2018-02-17 ENCOUNTER — Other Ambulatory Visit (INDEPENDENT_AMBULATORY_CARE_PROVIDER_SITE_OTHER): Payer: Managed Care, Other (non HMO)

## 2018-02-17 ENCOUNTER — Encounter: Payer: Self-pay | Admitting: Internal Medicine

## 2018-02-17 VITALS — BP 128/80 | HR 76 | Temp 98.0°F | Resp 16 | Ht 74.0 in | Wt 224.0 lb

## 2018-02-17 DIAGNOSIS — I77810 Thoracic aortic ectasia: Secondary | ICD-10-CM | POA: Diagnosis not present

## 2018-02-17 DIAGNOSIS — G43109 Migraine with aura, not intractable, without status migrainosus: Secondary | ICD-10-CM

## 2018-02-17 DIAGNOSIS — I471 Supraventricular tachycardia: Secondary | ICD-10-CM

## 2018-02-17 DIAGNOSIS — I10 Essential (primary) hypertension: Secondary | ICD-10-CM

## 2018-02-17 DIAGNOSIS — E782 Mixed hyperlipidemia: Secondary | ICD-10-CM

## 2018-02-17 DIAGNOSIS — R911 Solitary pulmonary nodule: Secondary | ICD-10-CM

## 2018-02-17 LAB — LIPID PANEL
Cholesterol: 204 mg/dL — ABNORMAL HIGH (ref 0–200)
HDL: 36.4 mg/dL — ABNORMAL LOW (ref >39.00)
LDL Cholesterol: 129 mg/dL — ABNORMAL HIGH (ref 0–99)
NONHDL: 167.92
Total CHOL/HDL Ratio: 6
Triglycerides: 195 mg/dL — ABNORMAL HIGH (ref 0.0–149.0)
VLDL: 39 mg/dL (ref 0.0–40.0)

## 2018-02-17 LAB — COMPREHENSIVE METABOLIC PANEL
ALT: 23 U/L (ref 0–53)
AST: 17 U/L (ref 0–37)
Albumin: 4.6 g/dL (ref 3.5–5.2)
Alkaline Phosphatase: 77 U/L (ref 39–117)
BUN: 14 mg/dL (ref 6–23)
CO2: 27 mEq/L (ref 19–32)
Calcium: 9.4 mg/dL (ref 8.4–10.5)
Chloride: 105 mEq/L (ref 96–112)
Creatinine, Ser: 0.73 mg/dL (ref 0.40–1.50)
GFR: 116.64 mL/min (ref >60.00)
GLUCOSE: 83 mg/dL (ref 70–99)
POTASSIUM: 4.2 meq/L (ref 3.5–5.1)
Sodium: 140 mEq/L (ref 135–145)
Total Bilirubin: 1.1 mg/dL (ref 0.2–1.2)
Total Protein: 7.1 g/dL (ref 6.0–8.3)

## 2018-02-17 MED ORDER — EPINEPHRINE 0.3 MG/0.3ML IJ SOAJ: 0.3000 mg | Freq: Once | INTRAMUSCULAR | 3 refills | Status: DC

## 2018-02-17 MED ORDER — SUMATRIPTAN SUCCINATE 25 MG PO TABS: 25.0000 mg | ORAL_TABLET | ORAL | 11 refills | Status: DC | PRN

## 2018-02-17 NOTE — Assessment & Plan Note (Signed)
BP well controlled Current regimen effective and well tolerated Continue current medications at current doses cmp  

## 2018-02-17 NOTE — Patient Instructions (Addendum)
  Tests ordered today. Your results will be released to MyChart (or called to you) after review, usually within 72hours after test completion. If any changes need to be made, you will be notified at that same time.   Medications reviewed and updated.  Changes include :   none  Your prescription(s) have been submitted to your pharmacy. Please take as directed and contact our office if you believe you are having problem(s) with the medication(s).    

## 2018-02-17 NOTE — Assessment & Plan Note (Signed)
CT 01/2018 4.8 cm Referred to cardiothoracic surgery

## 2018-02-17 NOTE — Assessment & Plan Note (Signed)
Ct 12.2019 - 5 mm RML nodule F/u only if high risk - he is low risk - no smoking h/o, occupational exposure or resp symptoms Will be having regularly imaging for thoracic aneurysm -- no dedicated f/u of nodule needed

## 2018-02-17 NOTE — Assessment & Plan Note (Addendum)
Chronic HA's, migraines every 6 week Takes imitrex prn - every 6 weeks Effective, continue - refilled

## 2018-02-17 NOTE — Assessment & Plan Note (Addendum)
Intolerant of statins - has taken a few and all resulted in side effects - aches and mild cognitive impairment Check lipids, cmp Consider another trial of low dose statin - TIW maybe Can also discuss PCSK9 drugs with cardio

## 2018-02-17 NOTE — Progress Notes (Signed)
Subjective:    Patient ID: Daniel Petty, male    DOB: 06/08/1960, 58 y.o.   MRN: [id number]  HPI The patient is here for follow up.  He recently saw cardiology for his one-year follow-up of his SVT.  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  He does have a history of a thoracic aortic aneurysm that was seen on an echo from 2015 and had a CT scan to follow-up on this.  There was an increase in size and it is currently 4.8 cm.  Cardiology referred him to thoracic surgery.  He was also started on metoprolol.  The CT scan also showed a small 5 mm right middle lobe pulmonary nodule.  Radiology advised no follow-up if low risk.  If high risk consider 8313-month follow-up.  He has never smoked.  He denies any concerning occupational risk.  He denies any current respiratory symptoms.  There is also coronary artery calcifications noted.  Cardiology did not feel that any further testing was needed, but urged LDL of less than 70.  He has taken statins in the past, but has not tolerated them.  He is exercising regularly.  He feels his cholesterol is genetic in nature.     Medications and allergies reviewed with patient and updated if appropriate.  Patient Active Problem List   Diagnosis Date Noted  . Ascending aorta dilatation (HCC) 01/11/2018  . Cough variant asthma 06/18/2017  . SVT (supraventricular tachycardia) (HCC) 10/07/2016  . Low testosterone 09/19/2016  . Arthralgia 09/17/2016  . Malaise and fatigue 09/11/2016  . PAC (premature atrial contraction) 09/13/2015  . PVC (premature ventricular contraction) 09/13/2015  . Migraines 05/14/2015  . Osteoarthritis 05/14/2015  . History of cardiac radiofrequency ablation 05/14/2015  . Essential hypertension, benign 05/14/2015    Current Outpatient Medications on File Prior to Visit  Medication Sig Dispense Refill  . amLODipine (NORVASC) 5 MG tablet Take 1 tablet (5 mg total) by mouth daily. 90 tablet 3  . metoprolol succinate (TOPROL  XL) 25 MG 24 hr tablet Take 1 tablet (25 mg total) by mouth daily. 90 tablet 3   No current facility-administered medications on file prior to visit.     Past Medical History:  Diagnosis Date  . Arthritis   . Ascending aorta dilation (HCC)   . Hyperlipemia   . Hypertension   . Low testosterone   . Migraine   . Premature atrial contractions   . PSVT (paroxysmal supraventricular tachycardia) (HCC)    a. syncope/HR 200s -> SVT/AVNRT s/p ablation 2015.  Marland Kitchen. PVC's (premature ventricular contractions)   . Syncope   . Tachycardia    6 [place]    Past Surgical History:  Procedure Laterality Date  . APPENDECTOMY    . CHOLECYSTECTOMY    . psvt typical    . TONSILLECTOMY    . typical AVNRT      Social History   Socioeconomic History  . Marital status: Married    Spouse name: Not on file  . Number of children: Not on file  . Years of education: Not on file  . Highest education level: Not on file  Occupational History  . Not on file  Social Needs  . Financial resource strain: Not on file  . Food insecurity:    Worry: Not on file    Inability: Not on file  . Transportation needs:    Medical: Not on file    Non-medical: Not on file  Tobacco Use  . Smoking status: Never Smoker  . Smokeless tobacco: Never Used  Substance and Sexual Activity  . Alcohol use: Yes  . Drug use: No  . Sexual activity: Not on file  Lifestyle  . Physical activity:    Days per week: Not on file    Minutes per session: Not on file  . Stress: Not on file  Relationships  . Social connections:    Talks on phone: Not on file    Gets together: Not on file    Attends religious service: Not on file    Active member of club or organization: Not on file    Attends meetings of clubs or organizations: Not on file    Relationship status: Not on file  Other Topics Concern  . Not on file  Social History Narrative   Exercise: regular    Family History  Problem Relation Age of Onset    . Hyperlipidemia Mother   . Heart attack Mother   . Multiple sclerosis Mother   . Arrhythmia Mother   . Allergies Mother   . Heart disease Father   . Allergies Father   . Multiple sclerosis Brother   . Hyperlipidemia Maternal Grandmother     Review of Systems  Constitutional: Negative for chills and fever.  Respiratory: Negative for cough, shortness of breath and wheezing.   Cardiovascular: Negative for chest pain, palpitations and leg swelling.  Neurological: Positive for headaches (daily since child). Negative for light-headedness.       Objective:   Vitals:   02/17/18 1345  BP: 128/80  Pulse: 76  Resp: 16  Temp: 98 F (36.7 C)  SpO2: 97%   BP Readings from Last 3 Encounters:  02/17/18 128/80  01/12/18 130/80  06/18/17 140/90   Wt Readings from Last 3 Encounters:  02/17/18 224 lb (101.6 kg)  01/12/18 223 lb 12.8 oz (101.5 kg)  06/18/17 229 lb 6.4 oz (104.1 kg)   Body mass index is 28.76 kg/m.   Physical Exam    Constitutional: Appears well-developed and well-nourished. No distress.  HENT:  Head: Normocephalic and atraumatic.  Neck: Neck supple. No tracheal deviation present. No thyromegaly present.  No cervical lymphadenopathy Cardiovascular: Normal rate, regular rhythm and normal heart sounds.   No murmur heard. No carotid bruit .  No edema Pulmonary/Chest: Effort normal and breath sounds normal. No respiratory distress. No has no wheezes. No rales.  Skin: Skin is warm and dry. Not diaphoretic.  Psychiatric: Normal mood and affect. Behavior is normal.      Assessment & Plan:    See Problem List for Assessment and Plan of chronic medical problems.

## 2018-02-18 ENCOUNTER — Encounter: Payer: Self-pay | Admitting: Internal Medicine

## 2018-03-03 ENCOUNTER — Encounter: Payer: Self-pay | Admitting: Nurse Practitioner

## 2018-03-03 ENCOUNTER — Ambulatory Visit: Payer: Managed Care, Other (non HMO) | Admitting: Nurse Practitioner

## 2018-03-03 VITALS — BP 128/88 | HR 90 | Temp 98.6°F | Ht 74.0 in | Wt 229.0 lb

## 2018-03-03 DIAGNOSIS — R6889 Other general symptoms and signs: Secondary | ICD-10-CM

## 2018-03-03 LAB — POC INFLUENZA A&B (BINAX/QUICKVUE)
Influenza A, POC: NEGATIVE
Influenza B, POC: NEGATIVE

## 2018-03-03 MED ORDER — OSELTAMIVIR PHOSPHATE 75 MG PO CAPS: 75.0000 mg | ORAL_CAPSULE | Freq: Two times a day (BID) | ORAL | 0 refills | Status: DC

## 2018-03-03 NOTE — Patient Instructions (Signed)
Take tamiflu as prescribed  Please follow up for fevers over 101, if your symptoms get worse, or if your symptoms dont get better in 1 week.   Influenza, Adult Influenza is also called "the flu." It is an infection in the lungs, nose, and throat (respiratory tract). It is caused by a virus. The flu causes symptoms that are similar to symptoms of a cold. It also causes a high fever and body aches. The flu spreads easily from person to person (is contagious). Getting a flu shot (influenza vaccination) every year is the best way to prevent the flu. What are the causes? This condition is caused by the influenza virus. You can get the virus by:  Breathing in droplets that are in the air from the cough or sneeze of a person who has the virus.  Touching something that has the virus on it (is contaminated) and then touching your mouth, nose, or eyes. What increases the risk? Certain things may make you more likely to get the flu. These include:  Not washing your hands often.  Having close contact with many people during cold and flu season.  Touching your mouth, eyes, or nose without first washing your hands.  Not getting a flu shot every year. You may have a higher risk for the flu, along with serious problems such as a lung infection (pneumonia), if you:  Are older than 65.  Are pregnant.  Have a weakened disease-fighting system (immune system) because of a disease or taking certain medicines.  Have a long-term (chronic) illness, such as: ? Heart, kidney, or lung disease. ? Diabetes. ? Asthma.  Have a liver disorder.  Are very overweight (morbidly obese).  Have anemia. This is a condition that affects your red blood cells. What are the signs or symptoms? Symptoms usually begin suddenly and last 4-14 days. They may include:  Fever and chills.  Headaches, body aches, or muscle aches.  Sore throat.  Cough.  Runny or stuffy (congested) nose.  Chest discomfort.  Not  wanting to eat as much as normal (poor appetite).  Weakness or feeling tired (fatigue).  Dizziness.  Feeling sick to your stomach (nauseous) or throwing up (vomiting). How is this treated? If the flu is found early, you can be treated with medicine that can help reduce how bad the illness is and how long it lasts (antiviral medicine). This may be given by mouth (orally) or through an IV tube. Taking care of yourself at home can help your symptoms get better. Your doctor may suggest:  Taking over-the-counter medicines.  Drinking plenty of fluids. The flu often goes away on its own. If you have very bad symptoms or other problems, you may be treated in a hospital. Follow these instructions at home:     Activity  Rest as needed. Get plenty of sleep.  Stay home from work or school as told by your doctor. ? Do not leave home until you do not have a fever for 24 hours without taking medicine. ? Leave home only to visit your doctor. Eating and drinking  Take an ORS (oral rehydration solution). This is a drink that is sold at pharmacies and stores.  Drink enough fluid to keep your pee (urine) pale yellow.  Drink clear fluids in small amounts as you are able. Clear fluids include: ? Water. ? Ice chips. ? Fruit juice that has water added (diluted fruit juice). ? Low-calorie sports drinks.  Eat bland, easy-to-digest foods in small amounts as you are  able. These foods include: ? Bananas. ? Applesauce. ? Rice. ? Lean meats. ? Toast. ? Crackers.  Do not eat or drink: ? Fluids that have a lot of sugar or caffeine. ? Alcohol. ? Spicy or fatty foods. General instructions  Take over-the-counter and prescription medicines only as told by your doctor.  Use a cool mist humidifier to add moisture to the air in your home. This can make it easier for you to breathe.  Cover your mouth and nose when you cough or sneeze.  Wash your hands with soap and water often, especially after you  cough or sneeze. If you cannot use soap and water, use alcohol-based hand sanitizer.  Keep all follow-up visits as told by your doctor. This is important. How is this prevented?   Get a flu shot every year. You may get the flu shot in late summer, fall, or winter. Ask your doctor when you should get your flu shot.  Avoid contact with people who are sick during fall and winter (cold and flu season). Contact a doctor if:  You get new symptoms.  You have: ? Chest pain. ? Watery poop (diarrhea). ? A fever.  Your cough gets worse.  You start to have more mucus.  You feel sick to your stomach.  You throw up. Get help right away if you:  Have shortness of breath.  Have trouble breathing.  Have skin or nails that turn a bluish color.  Have very bad pain or stiffness in your neck.  Get a sudden headache.  Get sudden pain in your face or ear.  Cannot eat or drink without throwing up. Summary  Influenza ("the flu") is an infection in the lungs, nose, and throat. It is caused by a virus.  Take over-the-counter and prescription medicines only as told by your doctor.  Getting a flu shot every year is the best way to avoid getting the flu. This information is not intended to replace advice given to you by your health care provider. Make sure you discuss any questions you have with your health care provider. Document Released: 11/06/2007 Document Revised: 07/15/2017 Document Reviewed: 07/15/2017 Elsevier Interactive Patient Education  2019 [employer].

## 2018-03-03 NOTE — Progress Notes (Signed)
Daniel Petty is a 58 y.o. male with the following history as recorded in [employer]:  Patient Active Problem List   Diagnosis Date Noted  . Mixed hyperlipidemia 02/17/2018  . Pulmonary nodule seen on imaging study 02/17/2018  . Ascending aorta dilatation (HCC) 01/11/2018  . Cough variant asthma 06/18/2017  . SVT (supraventricular tachycardia) (HCC) 10/07/2016  . Low testosterone 09/19/2016  . Arthralgia 09/17/2016  . PAC (premature atrial contraction) 09/13/2015  . PVC (premature ventricular contraction) 09/13/2015  . Migraines 05/14/2015  . Osteoarthritis 05/14/2015  . History of cardiac radiofrequency ablation 05/14/2015  . Essential hypertension, benign 05/14/2015    Current Outpatient Medications  Medication Sig Dispense Refill  . amLODipine (NORVASC) 5 MG tablet Take 1 tablet (5 mg total) by mouth daily. 90 tablet 3  . metoprolol succinate (TOPROL XL) 25 MG 24 hr tablet Take 1 tablet (25 mg total) by mouth daily. 90 tablet 3  . SUMAtriptan (IMITREX) 25 MG tablet Take 1 tablet (25 mg total) by mouth every 2 (two) hours as needed for migraine. May repeat in 2 hours if headache persists or recurs. 10 tablet 11   No current facility-administered medications for this visit.     Allergies: Penicillins; Statins; Yellow jacket venom; and Sulfa antibiotics  Past Medical History:  Diagnosis Date  . Arthritis   . Ascending aorta dilation (HCC)   . Hyperlipemia   . Hypertension   . Low testosterone   . Migraine   . Premature atrial contractions   . PSVT (paroxysmal supraventricular tachycardia) (HCC)    a. syncope/HR 200s -> SVT/AVNRT s/p ablation 2015.  Marland Kitchen PVC's (premature ventricular contractions)   . Syncope   . Tachycardia    6 years ago [place] in the [place]    Past Surgical History:  Procedure Laterality Date  . APPENDECTOMY    . CHOLECYSTECTOMY    . psvt typical    . TONSILLECTOMY    . typical AVNRT      Family History  Problem Relation Age of Onset  . Hyperlipidemia  Mother   . Heart attack Mother   . Multiple sclerosis Mother   . Arrhythmia Mother   . Allergies Mother   . Heart disease Father   . Allergies Father   . Multiple sclerosis Brother   . Hyperlipidemia Maternal Grandmother     Social History   Tobacco Use  . Smoking status: Never Smoker  . Smokeless tobacco: Never Used  Substance Use Topics  . Alcohol use: Yes     Subjective:   [name] is here today requesting evaluation of acute complaint of cough/cold symptoms, which first began yesterday. Reports: fevers, chills, headaches, body aches, scratchy throat, nonproductive cough Denies: syncope, confusion, cp, sob, abdominal pain, nausea,vomiting, bowel or bladder changes, diarrhea Smoker? no Tried at home: tylenol- helps some  Wife and friends he saw over the weekend tested positive for flu today  ROS- See HPI  Objective:  Vitals:   03/03/18 1055  BP: 128/88  Pulse: 90  Temp: 98.6 F (37 C)  TempSrc: Oral  SpO2: 98%  Weight: 229 lb (103.9 kg)  Height: 6\' 2"  (1.88 m)    General: Well developed, well nourished, in no acute distress  Skin : Warm and dry.  Head: Normocephalic and atraumatic  Eyes: Sclera and conjunctiva clear; pupils round and reactive to light; extraocular movements intact  Oropharynx: Pink, supple. No suspicious lesions  Neck: Supple without thyromegaly Lungs: Respirations unlabored; clear to auscultation bilaterally without wheeze, rales, rhonchi  CVS exam: normal rate and regular rhythm, S1 and S2 normal.  Extremities: No edema, cyanosis, clubbing  Vessels: Symmetric bilaterally  Neurologic: Alert and oriented; speech intact; face symmetrical; moves all extremities well; CNII-XII intact without focal deficit  Psychiatric: Normal mood and affect.  Assessment:  1. Flu-like symptoms     Plan:   POCT flu is negative today Due to symptoms, exposure, will go ahead and treat for flu with tamiflu course-medication dosing, side effects  discussed Home management, red flags and return precautions including when to seek immediate care discussed and printed on AVS  No follow-ups on file.  Orders Placed This Encounter  Procedures  . POC Influenza A&B (Binax test)    Requested Prescriptions    No prescriptions requested or ordered in this encounter

## 2018-03-04 ENCOUNTER — Encounter: Payer: Managed Care, Other (non HMO) | Admitting: Cardiothoracic Surgery

## 2018-03-16 ENCOUNTER — Other Ambulatory Visit: Payer: Self-pay | Admitting: Internal Medicine

## 2018-03-16 ENCOUNTER — Institutional Professional Consult (permissible substitution) (INDEPENDENT_AMBULATORY_CARE_PROVIDER_SITE_OTHER): Payer: Managed Care, Other (non HMO) | Admitting: Cardiothoracic Surgery

## 2018-03-16 ENCOUNTER — Other Ambulatory Visit: Payer: Self-pay | Admitting: *Deleted

## 2018-03-16 VITALS — BP 137/85 | HR 66 | Resp 20 | Ht 74.0 in | Wt 225.0 lb

## 2018-03-16 DIAGNOSIS — I712 Thoracic aortic aneurysm, without rupture, unspecified: Secondary | ICD-10-CM

## 2018-03-16 DIAGNOSIS — I7121 Aneurysm of the ascending aorta, without rupture: Secondary | ICD-10-CM

## 2018-03-16 DIAGNOSIS — I10 Essential (primary) hypertension: Secondary | ICD-10-CM

## 2018-03-16 DIAGNOSIS — I351 Nonrheumatic aortic (valve) insufficiency: Secondary | ICD-10-CM

## 2018-03-16 NOTE — Progress Notes (Signed)
[address]       [place] [address]             [phone]                    [name] [facility] Medical Record #[id number] Date of Birth: November 09, 1960  Referring: Laurann Montana, PA-C Primary Care: Pincus Sanes, MD Primary Cardiologist: Lance Muss, MD  Chief Complaint:   No chief complaint on file.   History of Present Illness:    Daniel Petty 58 y.o. male is seen in the office  today for evaluation of a dilated ascending aorta.  The patient originally presented to [facility] in [place] after multiple episodes of syncope with supraventricular tachycardia.  Ultimately he underwent ablation in [place] and notes no further problems with supraventricular tachycardia.  In March 2015 in September 2015 echocardiograms were done while he was living in [place], one mention normal aortic size and a trileaflet aortic valve, the second echo noted ascending aorta to be 4.52 cm, with trace to mild aortic insufficiency, no mention if it was a bicuspid or trileaflet aortic valve.   Patient has had no history of coronary occlusive disease, he has been on statins in the past but has not tolerated them well and is currently not taking any.  He noted the main symptoms were overall fatigue and muscle discomfort.   On routine visit to cardiology office the findings of his echocardiogram in 2015 were noted and it was recommended that he obtain a CT scan of his chest which was done and the patient referred to the surgical office.   His family history is significant for the lack of history of dissections, dilated ascending aorta's.  His mother has had a myocardial infarction, father has had a pacemaker defibrillator placed, both his mother and brother have multiple sclerosis.  A maternal grandmother died at age 7 of a myocardial infarction, maternal grandfather died of cranial hemorrhage at a young age.  The  patient and his wife have 2 children who are  healthy.      Current Activity/ Functional Status:  Patient is independent with mobility/ambulation, transfers, ADL's, IADL's.   Zubrod Score: At the time of surgery this patient's most appropriate activity status/level should be described as: [x]     0    Normal activity, no symptoms []     1    Restricted in physical strenuous activity but ambulatory, able to do out light work []     2    Ambulatory and capable of self care, unable to do work activities, up and about               >50 % of waking hours                              []     3    Only limited self care, in bed greater than 50% of waking hours []     4    Completely disabled, no self care, confined to bed or chair []     5    Moribund   Past Medical History:  Diagnosis Date  . Arthritis   . Ascending aorta dilation (HCC)   . Hyperlipemia   . Hypertension   . Low testosterone   . Migraine   . Premature atrial contractions   . PSVT (paroxysmal supraventricular tachycardia) (HCC)  a. syncope/HR 200s -> SVT/AVNRT s/p ablation 2015.  Marland Kitchen. PVC's (premature ventricular contractions)   . Syncope   . Tachycardia    6 [place]    Past Surgical History:  Procedure Laterality Date  . APPENDECTOMY    . CHOLECYSTECTOMY    . psvt typical    . TONSILLECTOMY    . typical AVNRT      Family History  Problem Relation Age of Onset  . Hyperlipidemia Mother   . Heart attack Mother   . Multiple sclerosis Mother   . Arrhythmia Mother   . Allergies Mother   . Heart disease Father   . Allergies Father   . Multiple sclerosis Brother   . Hyperlipidemia Maternal Grandmother      Social History   Tobacco Use  Smoking Status Never Smoker  Smokeless Tobacco Never Used    Social History   Substance and Sexual Activity  Alcohol Use Yes     Allergies  Allergen Reactions  . Penicillins Anaphylaxis  . Quinolones     Patient was warned about not using Cipro and similar antibiotics. Recent studies have  raised concern that fluoroquinolone antibiotics could be associated with an increased risk of aortic aneurysm Fluoroquinolones have non-antimicrobial properties that might jeopardise the integrity of the extracellular matrix of the vascular wall In a  propensity score matched cohort study in [place], there was a 66% increased rate of aortic aneurysm or dissection associated with oral fluoroquinolone use, compared wit  . Statins     Cognitive impairment muscle pain  . [place] Venom   . Sulfa Antibiotics Rash    Current Outpatient Medications  Medication Sig Dispense Refill  . amLODipine (NORVASC) 5 MG tablet TAKE 1 TABLET(5 MG) BY MOUTH DAILY 90 tablet 3  . metoprolol succinate (TOPROL XL) 25 MG 24 hr tablet Take 1 tablet (25 mg total) by mouth daily. 90 tablet 3  . SUMAtriptan (IMITREX) 25 MG tablet Take 1 tablet (25 mg total) by mouth every 2 (two) hours as needed for migraine. May repeat in 2 hours if headache persists or recurs. 10 tablet 11   No current facility-administered medications for this visit.     Pertinent items are noted in HPI.   Review of Systems:     Cardiac Review of Systems: [Y] = yes  or   [ N ] = no   Chest Pain [ N   ]  Resting SOB Klaus.Mock[N   ] Exertional SOB  Klaus.Mock[N  ]  Pollyann Kennedyrthopnea Klaus.Mock[N  ]   Pedal Edema [   ]    Palpitations [  ] Syncope  [Y associated with svt , but none since ablation   ]   Presyncope [  N ]   General Review of Systems: [Y] = yes [  ]=no Constitional: recent weight change [  ];  Wt loss over the last 3 months [   ] anorexia [  ]; fatigue [  ]; nausea [  ]; night sweats [  ]; fever [  ]; or chills [  ];           Eye : blurred vision [  ]; diplopia [   ]; vision changes [  ];  Amaurosis fugax[  ]; Resp: cough [  ];  wheezing[  ];  hemoptysis[  ]; shortness of breath[  ]; paroxysmal nocturnal dyspnea[  ]; dyspnea on exertion[  ]; or orthopnea[  ];  GI:  gallstones[  ],  vomiting[  ];  dysphagia[  ]; melena[  ];  hematochezia [  ]; heartburn[  ];   Hx of   Colonoscopy[  ]; GU: kidney stones [  ]; hematuria[  ];   dysuria [  ];  nocturia[  ];  history of     obstruction [  ]; urinary frequency [  ]             Skin: rash, swelling[  ];, hair loss[  ];  peripheral edema[  ];  or itching[  ]; Musculosketetal: myalgias[  ];  joint swelling[  ];  joint erythema[  ];  joint pain[  ];  back pain[  ];  Heme/Lymph: bruising[  ];  bleeding[  ];  anemia[  ];  Neuro: TIA[  ];  headaches[ y with migraine symptoms and visual changes right eye has had for years  ];  stroke[  ];  vertigo[  ];  seizures[  ];   paresthesias[  ];  difficulty walking[  ];  Psych:depression[  ]; anxiety[  ];  Endocrine: diabetes[  ];  thyroid dysfunction[  ];  Immunizations: Flu up to date [ y ]; Pneumococcal up to date [  n];  Other:     PHYSICAL EXAMINATION: BP 137/85   Pulse 66   Resp 20   Ht 6\' 2"  (1.88 m)   Wt 225 lb (102.1 kg)   SpO2 98% Comment: RA  BMI 28.89 kg/m  General appearance: alert, cooperative, appears stated age and no distress Head: Normocephalic, without obvious abnormality, atraumatic Neck: no adenopathy, no carotid bruit, no JVD, supple, symmetrical, trachea midline and thyroid not enlarged, symmetric, no tenderness/mass/nodules Lymph nodes: Cervical, supraclavicular, and axillary nodes normal. Resp: clear to auscultation bilaterally Cardio: regular rate and rhythm, S1, S2 normal, no murmur, click, rub or gallop GI: soft, non-tender; bowel sounds normal; no masses,  no organomegaly Extremities: extremities normal, atraumatic, no cyanosis or edema Neurologic: Grossly normal No physical stigmata to suggest congenital connective tissue disorder  Diagnostic Studies & Laboratory data:     Recent Radiology Findings:   CLINICAL DATA:  58 year old male with a history of thoracic aortic aneurysm  EXAM: CT ANGIOGRAPHY CHEST WITH CONTRAST  TECHNIQUE: Multidetector CT imaging of the chest was performed using the standard protocol during bolus  administration of intravenous contrast. Multiplanar CT image reconstructions and MIPs were obtained to evaluate the vascular anatomy.  CONTRAST:  ISOVUE-370 IOPAMIDOL (ISOVUE-370) INJECTION 76%  COMPARISON:  Prior chest x-ray 04/30/2017  FINDINGS: Cardiovascular: Fusiform aneurysmal dilatation of the tubular portion of the ascending thoracic aorta with a maximal diameter of 4.8 cm. No evidence of dissection. Conventional 3 vessel arch anatomy. Calcifications present along the coronary arteries. The heart is normal in size. No pericardial effusion. Unremarkable pulmonary venous drainage.  Mediastinum/Nodes: Unremarkable CT appearance of the thyroid gland. No suspicious mediastinal or hilar adenopathy. No soft tissue mediastinal mass. The thoracic esophagus is unremarkable. There are a few small calcified right-sided hilar lymph nodes suggesting old granulomatous disease.  Lungs/Pleura: The lungs are largely clear. However, there is a solitary 5 mm pulmonary nodule within the right middle lobe (image 61, series 5). No evidence of emphysema, bronchial wall thickening, pulmonary edema, pneumothorax or pleural effusion.  Upper Abdomen: No acute abnormality.  Musculoskeletal: No acute fracture or aggressive appearing lytic or blastic osseous lesion.  Review of the MIP images confirms the above findings.  IMPRESSION: 1. Fusiform aneurysmal dilatation of the ascending thoracic aorta with a maximal diameter of 4.8  cm. Ascending thoracic aortic aneurysm. Recommend semi-annual imaging followup by CTA or MRA and referral to cardiothoracic surgery if not already obtained. This recommendation follows 2010 ACCF/AHA/AATS/ACR/ASA/SCA/SCAI/SIR/STS/SVM Guidelines for the Diagnosis and Management of Patients With Thoracic Aortic Disease. Circulation. 2010; 121: Z610-R604. Aortic aneurysm NOS (ICD10-I71.9); Aortic Atherosclerosis (ICD10-170.0) 2. Small 5 mm right middle lobe  pulmonary nodule. No follow-up needed if patient is low-risk. Non-contrast chest CT can be considered in 12 months if patient is high-risk. This recommendation follows the consensus statement: Guidelines for Management of Incidental Pulmonary Nodules Detected on CT Images: From the Fleischner Society 2017; Radiology 2017; 284:228-243. 3. Coronary artery calcifications.  Signed,  Sterling Big, MD, RPVI  [facility]  [facility]   Electronically Signed   By: Malachy Moan M.D.   On: 01/28/2018 16:45  I have independently reviewed the above radiology studies  and reviewed the findings with the patient.   Recent Lab Findings: Lab Results  Component Value Date   WBC 8.6 06/18/2017   HGB 14.6 06/18/2017   HCT 43.4 06/18/2017   PLT 262.0 06/18/2017   GLUCOSE 83 02/17/2018   CHOL 204 (H) 02/17/2018   TRIG 195.0 (H) 02/17/2018   HDL 36.40 (L) 02/17/2018   LDLCALC 129 (H) 02/17/2018   ALT 23 02/17/2018   AST 17 02/17/2018   NA 140 02/17/2018   K 4.2 02/17/2018   CL 105 02/17/2018   CREATININE 0.73 02/17/2018   BUN 14 02/17/2018   CO2 27 02/17/2018   TSH 1.92 09/11/2016    Aortic Size Index=    4.8     /Body surface area is 2.31 meters squared. = 2.07  < 2.75 cm/m2      4% risk per year 2.75 to 4.25          8% risk per year > 4.25 cm/m2    20% risk per year    Assessment / Plan:   #1 fusiform dilatation of the ascending aorta, 4.7 to 4.8 cm by CT scan, suggested at 4.52 cm 5 years ago by echocardiogram-patient has no stigmata of connective tissue disorder, no family history of dissection or sudden death, we have no current echocardiogram to confirm tricuspid versus bicuspid valve.  One type report from March 15 suggested trileaflet valve, follow-up echocardiogram September 2015 status of the valves not mentioned.  I reviewed with the patient and his wife the diagnosis of dilated ascending aorta and the  implications for close follow-up good blood pressure management, avoiding strenuous/Valsalva maneuvers and avoid quinolones.  We will obtain an echocardiogram for follow-up of the previous 2 done 5 years ago, and confirm trileaflet versus bicuspid aortic valve.  Since we have no previous CT scans or MRIs of the chest I suggested to the patient that we obtain a follow-up CTA of the chest in 6 months.  He and his wife were given printed material concerning thoracic aortic aneurysms and the signs and symptoms of aortic dissection.  #2  5 mm right middle lobe nodule and lifelong non-smoker-no further follow-up necessary but incidentally will have follow-up at the time of CT scan of the chest.  #3  Coronary calcifications on CT noted, patient denies signs and symptoms of coronary disease, or angina  #4 hyperlipidemia with LDH of 129, intolerant of statins-cardiology managing According to the 2010 ACC/AHA guidelines, we recommend patients with thoracic aortic disease to maintain a LDL of less than 70 and a HDL of greater than 50. We recommend their blood pressure  to remain less than 135/85.   Patient was warned about not using Cipro and similar antibiotics. Recent studies have raised concern that fluoroquinolone antibiotics could be associated with an increased risk of aortic aneurysm Fluoroquinolones have non-antimicrobial properties that might jeopardise the integrity of the extracellular matrix of the vascular wall In a  propensity score matched cohort study in [place], there was a 66% increased rate of aortic aneurysm or dissection associated with oral fluoroquinolone use, compared with amoxicillin use, within a 60 day risk period from start of treatment  I  spent 60 minutes with  the patient face to face and greater then 50% of the time was spent in counseling and coordination of care.    Delight Ovens MD      [address] [place] [place] [address] Office [phone]   Beeper  [phone]  03/16/2018 3:08 PM

## 2018-03-16 NOTE — Patient Instructions (Signed)

## 2018-03-23 ENCOUNTER — Ambulatory Visit (HOSPITAL_COMMUNITY): Payer: Managed Care, Other (non HMO) | Attending: Cardiology

## 2018-03-23 DIAGNOSIS — I351 Nonrheumatic aortic (valve) insufficiency: Secondary | ICD-10-CM

## 2018-03-23 DIAGNOSIS — I712 Thoracic aortic aneurysm, without rupture, unspecified: Secondary | ICD-10-CM

## 2018-08-17 ENCOUNTER — Other Ambulatory Visit: Payer: Self-pay | Admitting: Cardiothoracic Surgery

## 2018-08-17 DIAGNOSIS — I712 Thoracic aortic aneurysm, without rupture, unspecified: Secondary | ICD-10-CM

## 2018-09-23 ENCOUNTER — Other Ambulatory Visit: Payer: Self-pay

## 2018-09-23 ENCOUNTER — Ambulatory Visit (INDEPENDENT_AMBULATORY_CARE_PROVIDER_SITE_OTHER): Payer: Managed Care, Other (non HMO) | Admitting: Cardiothoracic Surgery

## 2018-09-23 ENCOUNTER — Ambulatory Visit
Admission: RE | Admit: 2018-09-23 | Discharge: 2018-09-23 | Disposition: A | Discharge status: Home / Self Care | Payer: Managed Care, Other (non HMO) | Source: Ambulatory Visit | Attending: Cardiothoracic Surgery | Admitting: Cardiothoracic Surgery

## 2018-09-23 VITALS — BP 150/99 | HR 68 | Resp 20 | Ht 74.0 in | Wt 223.0 lb

## 2018-09-23 DIAGNOSIS — I712 Thoracic aortic aneurysm, without rupture, unspecified: Secondary | ICD-10-CM

## 2018-09-23 DIAGNOSIS — I351 Nonrheumatic aortic (valve) insufficiency: Secondary | ICD-10-CM | POA: Diagnosis not present

## 2018-09-23 DIAGNOSIS — I7121 Aneurysm of the ascending aorta, without rupture: Secondary | ICD-10-CM

## 2018-09-23 MED ORDER — IOPAMIDOL (ISOVUE-370) INJECTION 76%
75.0000 mL | Freq: Once | INTRAVENOUS | Status: AC | PRN
  Administered 2018-09-23: 75 mL via INTRAVENOUS

## 2018-09-23 NOTE — Progress Notes (Signed)
[address]       [place] [id number]             [phone]                    [facility]aurier NancyMark [facility] Medical Record #[id number] Date of Birth: 06/02/1960  Referring: Laurann Montanaunn, Dayna N, PA-C Primary Care: Pincus SanesBurns, Stacy J, MD Primary Cardiologist: Lance MussJayadeep Varanasi, MD  Chief Complaint:    Chief Complaint  Patient presents with   TAA    6 month f/u with CTA CHEST today    History of Present Illness:    Daniel Petty 58 y.o. male is seen in the office  today for follow up evaluation of a dilated ascending aorta.  The patient originally presented to [facility] in [place] after multiple episodes of syncope with supraventricular tachycardia.  Ultimately he underwent ablation in [place] and notes no further problems with supraventricular tachycardia.  In March 2015 in September 2015 echocardiograms were done while he was living in [place], one mention normal aortic size and a trileaflet aortic valve, the second echo noted ascending aorta to be 4.52 cm, with trace to mild aortic insufficiency, no mention if it was a bicuspid or trileaflet aortic valve.   Patient has no history of coronary occlusive disease.  He has not tolerated statins in the past.  He noted the main symptoms were overall fatigue and muscle discomfort.   On routine visit to cardiology office the findings of his echocardiogram in 2015 were noted and it was recommended that he obtain a CT scan of his chest which was done and the patient referred to the surgical office.   His family history is significant for the lack of history of dissections, dilated ascending aorta's.  His mother has had a myocardial infarction, father has had a pacemaker defibrillator placed, both his mother and brother have multiple sclerosis.  A maternal grandmother died at age 60 of a myocardial infarction, maternal grandfather died of cranial hemorrhage at a young age.  The  patient and his wife have 2 children who  are healthy.      Current Activity/ Functional Status:  Patient is independent with mobility/ambulation, transfers, ADL's, IADL's.   Zubrod Score: At the time of surgery this patients most appropriate activity status/level should be described as: [x]     0    Normal activity, no symptoms []     1    Restricted in physical strenuous activity but ambulatory, able to do out light work []     2    Ambulatory and capable of self care, unable to do work activities, up and about               >50 % of waking hours                              []     3    Only limited self care, in bed greater than 50% of waking hours []     4    Completely disabled, no self care, confined to bed or chair []     5    Moribund   Past Medical History:  Diagnosis Date   Arthritis    Ascending aorta dilation (HCC)    Hyperlipemia    Hypertension    Low testosterone    Migraine    Premature atrial contractions    PSVT (  paroxysmal supraventricular tachycardia) (HCC)    a. syncope/HR 200s -> SVT/AVNRT s/p ablation 2015.   PVC's (premature ventricular contractions)    Syncope    Tachycardia    [place]    Past Surgical History:  Procedure Laterality Date   APPENDECTOMY     CHOLECYSTECTOMY     psvt typical     TONSILLECTOMY     typical AVNRT      Family History  Problem Relation Age of Onset   Hyperlipidemia Mother    Heart attack Mother    Multiple sclerosis Mother    Arrhythmia Mother    Allergies Mother    Heart disease Father    Allergies Father    Multiple sclerosis Brother    Hyperlipidemia Maternal Grandmother      Social History   Tobacco Use  Smoking Status Never Smoker  Smokeless Tobacco Never Used    Social History   Substance and Sexual Activity  Alcohol Use Yes     Allergies  Allergen Reactions   Penicillins Anaphylaxis   Quinolones     Patient was warned about not using Cipro and similar antibiotics. Recent studies  have raised concern that fluoroquinolone antibiotics could be associated with an increased risk of aortic aneurysm Fluoroquinolones have non-antimicrobial properties that might jeopardise the integrity of the extracellular matrix of the vascular wall In a  propensity score matched cohort study in [place], there was a 66% increased rate of aortic aneurysm or dissection associated with oral fluoroquinolone use, compared wit   Statins     Cognitive impairment muscle pain   Yellow Jacket Venom    Sulfa Antibiotics Rash    Current Outpatient Medications  Medication Sig Dispense Refill   amLODipine (NORVASC) 5 MG tablet TAKE 1 TABLET(5 MG) BY MOUTH DAILY 90 tablet 3   metoprolol succinate (TOPROL XL) 25 MG 24 hr tablet Take 1 tablet (25 mg total) by mouth daily. 90 tablet 3   SUMAtriptan (IMITREX) 25 MG tablet Take 1 tablet (25 mg total) by mouth every 2 (two) hours as needed for migraine. May repeat in 2 hours if headache persists or recurs. 10 tablet 11   No current facility-administered medications for this visit.     Pertinent items are noted in HPI.   Review of Systems:     Cardiac Review of Systems: [Y] = yes  or   [ N ] = no   Chest Pain [n  ]  Resting SOB [n  ] Exertional SOB  [n ]  Orthopnea Milo.Brash  ]   Pedal Edema [   ]    Palpitations [  ] Syncope  [Y associated with svt , but none since ablation   ]   Presyncope [  n ]   General Review of Systems: [Y] = yes [  ]=no Constitional: recent weight change [  ];  Wt loss over the last 3 months [   ] anorexia [  ]; fatigue [  ]; nausea [  ]; night sweats [  ]; fever [  ]; or chills [  ];           Eye : blurred vision [  ]; diplopia [   ]; vision changes [  ];  Amaurosis fugax[  ]; Resp: cough [  ];  wheezing[  ];  hemoptysis[  ]; shortness of breath[  ]; paroxysmal nocturnal dyspnea[  ]; dyspnea on exertion[  ]; or orthopnea[  ];  GI:  gallstones[  ], vomiting[  ];  dysphagia[  ]; melena[  ];  hematochezia [  ]; heartburn[  ];   Hx of   Colonoscopy[  ]; GU: kidney stones [  ]; hematuria[  ];   dysuria [  ];  nocturia[  ];  history of     obstruction [  ]; urinary frequency [  ]             Skin: rash, swelling[  ];, hair loss[  ];  peripheral edema[  ];  or itching[  ]; Musculosketetal: myalgias[  ];  joint swelling[  ];  joint erythema[  ];  joint pain[  ];  back pain[  ];  Heme/Lymph: bruising[  ];  bleeding[  ];  anemia[  ];  Neuro: TIA[  ];  headaches[ y with migraine symptoms and visual changes right eye has had for years  ];  stroke[  ];  vertigo[  ];  seizures[  ];   paresthesias[  ];  difficulty walking[  ];  Psych:depression[  ]; anxiety[  ];  Endocrine: diabetes[  ];  thyroid dysfunction[  ];  Immunizations: Flu up to date Cove.Etienne[y ]; Pneumococcal up to date [ n];  Other:     PHYSICAL EXAMINATION: BP (!) 150/99    Pulse 68    Resp 20    Ht 6\' 2"  (1.88 m)    Wt 223 lb (101.2 kg)    SpO2 98% Comment: RA   BMI 28.63 kg/m  General appearance: alert, cooperative and no distress Head: Normocephalic, without obvious abnormality, atraumatic Neck: no adenopathy, no carotid bruit, no JVD, supple, symmetrical, trachea midline and thyroid not enlarged, symmetric, no tenderness/mass/nodules Lymph nodes: Cervical, supraclavicular, and axillary nodes normal. Resp: clear to auscultation bilaterally Back: symmetric, no curvature. ROM normal. No CVA tenderness. Cardio: regular rate and rhythm, S1, S2 normal, no murmur, click, rub or gallop GI: soft, non-tender; bowel sounds normal; no masses,  no organomegaly Extremities: extremities normal, atraumatic, no cyanosis or edema Neurologic: Grossly normal No physical stigmata to suggest congenital connective tissue disorder  Diagnostic Studies & Laboratory data:     Recent Radiology Findings:   Ct Angio Chest Aorta W &/or Wo Contrast  Result Date: 09/23/2018 CLINICAL DATA:  Follow-up known thoracic aortic aneurysm. EXAM: CT ANGIOGRAPHY CHEST WITH CONTRAST TECHNIQUE: Multidetector  CT imaging of the chest was performed using the standard protocol during bolus administration of intravenous contrast. Multiplanar CT image reconstructions and MIPs were obtained to evaluate the vascular anatomy. Creat[facility] at [address] Results: Creatinine 0.7 mg/dL. CONTRAST:  75mL ISOVUE-370 IOPAMIDOL (ISOVUE-370) INJECTION 76% COMPARISON:  01/28/2018 FINDINGS: Cardiovascular: Fusiform aneurysmal dilatation of the ascending aorta, maximal dimension 4.7 cm (measured image 80 series 5), previously 4.8 cm. Measurement differences likely due to caliper placement. Slight tortuosity of the descending aorta. No aortic dissection. Trace atherosclerosis of the aortic arch. Conventional branching pattern from the aortic arch. Heart is normal in size. There are coronary artery calcifications. No pericardial effusion. There are no filling defects in the central pulmonary arteries. Mediastinum/Nodes: No suspicion mediastinal or hilar adenopathy. Few calcified lymph nodes lower mediastinum and right hilum consistent with prior granulomatous disease. Visualized thyroid gland is unremarkable. The esophagus is decompressed. Lungs/Pleura: 5 mm right middle lobe pulmonary nodule, image 93 series 7, unchanged from prior exam. Calcified granuloma in the right upper lobe. The lungs are otherwise clear. Focal airspace disease or pleural fluid. Upper Abdomen: No acute  abnormality. Musculoskeletal: There are no acute or suspicious osseous abnormalities. Scattered incidental vertebral body hemangiomas. Review of the MIP images confirms the above findings. IMPRESSION: 1. Stable fusiform aneurysmal dilatation of the ascending aorta allowing for differences in caliper placement, maximal dimension 4.7 cm. No dissection or acute aortic abnormality. Recommend semi-annual imaging followup by CTA or MRA and referral to cardiothoracic surgery if not already obtained. This recommendation follows  2010 ACCF/AHA/AATS/ACR/ASA/SCA/SCAI/SIR/STS/SVM Guidelines for the Diagnosis and Management of Patients With Thoracic Aortic Disease. Circulation. 2010; 121: D622-W979. Aortic aneurysm NOS (ICD10-I71.9) 2. Unchanged 5 mm right middle lobe pulmonary nodule over the past 8 months. 3. Coronary artery calcifications. Aortic Atherosclerosis (ICD10-I70.0). Electronically Signed   By: Keith Rake M.D.   On: 09/23/2018 15:08   I have independently reviewed the above radiology studies  and reviewed the findings with the patient.    CLINICAL DATA:  58 year old male with a history of thoracic aortic aneurysm  EXAM: CT ANGIOGRAPHY CHEST WITH CONTRAST  TECHNIQUE: Multidetector CT imaging of the chest was performed using the standard protocol during bolus administration of intravenous contrast. Multiplanar CT image reconstructions and MIPs were obtained to evaluate the vascular anatomy.  CONTRAST:  134mL ISOVUE-370 IOPAMIDOL (ISOVUE-370) INJECTION 76%  COMPARISON:  Prior chest x-ray 04/30/2017  FINDINGS: Cardiovascular: Fusiform aneurysmal dilatation of the tubular portion of the ascending thoracic aorta with a maximal diameter of 4.8 cm. No evidence of dissection. Conventional 3 vessel arch anatomy. Calcifications present along the coronary arteries. The heart is normal in size. No pericardial effusion. Unremarkable pulmonary venous drainage.  Mediastinum/Nodes: Unremarkable CT appearance of the thyroid gland. No suspicious mediastinal or hilar adenopathy. No soft tissue mediastinal mass. The thoracic esophagus is unremarkable. There are a few small calcified right-sided hilar lymph nodes suggesting old granulomatous disease.  Lungs/Pleura: The lungs are largely clear. However, there is a solitary 5 mm pulmonary nodule within the right middle lobe (image 61, series 5). No evidence of emphysema, bronchial wall thickening, pulmonary edema, pneumothorax or pleural effusion.  Upper  Abdomen: No acute abnormality.  Musculoskeletal: No acute fracture or aggressive appearing lytic or blastic osseous lesion.  Review of the MIP images confirms the above findings.  IMPRESSION: 1. Fusiform aneurysmal dilatation of the ascending thoracic aorta with a maximal diameter of 4.8 cm. Ascending thoracic aortic aneurysm. Recommend semi-annual imaging followup by CTA or MRA and referral to cardiothoracic surgery if not already obtained. This recommendation follows 2010 ACCF/AHA/AATS/ACR/ASA/SCA/SCAI/SIR/STS/SVM Guidelines for the Diagnosis and Management of Patients With Thoracic Aortic Disease. Circulation. 2010; 121: G921-J941. Aortic aneurysm NOS (ICD10-I71.9); Aortic Atherosclerosis (ICD10-170.0) 2. Small 5 mm right middle lobe pulmonary nodule. No follow-up needed if patient is low-risk. Non-contrast chest CT can be considered in 12 months if patient is high-risk. This recommendation follows the consensus statement: Guidelines for Management of Incidental Pulmonary Nodules Detected on CT Images: From the Fleischner Society 2017; Radiology 2017; 284:228-243. 3. Coronary artery calcifications.  Signed,  Criselda Peaches, MD, [facility]  [facility]   Electronically Signed   By: Jacqulynn Cadet M.D.   On: 01/28/2018 16:45  Recent Lab Findings: Lab Results  Component Value Date   WBC 8.6 06/18/2017   HGB 14.6 06/18/2017   HCT 43.4 06/18/2017   PLT 262.0 06/18/2017   GLUCOSE 83 02/17/2018   CHOL 204 (H) 02/17/2018   TRIG 195.0 (H) 02/17/2018   HDL 36.40 (L) 02/17/2018   LDLCALC 129 (H) 02/17/2018   ALT 23 02/17/2018   AST 17  02/17/2018   NA 140 02/17/2018   K 4.2 02/17/2018   CL 105 02/17/2018   CREATININE 0.73 02/17/2018   BUN 14 02/17/2018   CO2 27 02/17/2018   TSH 1.92 09/11/2016   echocardiogram Result status: Final result    ECHOCARDIOGRAM REPORT       [facility]    [facility] Date of Exam: 03/23/2018 Medical Rec #:  [id number]        Height:       74.0 in Accession #:    [id number]       Weight:       225.0 lb Date of Birth:  06/10/1960         BSA:          2.29 m Patient Age:    57 years         BP:           128/88 mmHg Patient Gender: M                HR:           68 bpm. Exam Location:  [address]    Procedure: 2D Echo, 3D Echo, Cardiac Doppler and Color Doppler  Indications:    I35.1 AI   History:        Patient has prior history of Echocardiogram examinations, most                 recent 10/25/2013. PVCs,PACs, AI; Risk Factors: Hypertension and                 Dyslipidemia; Medications: Dilated ascending aorta.   Sonographer:    Samule OhmWilliam Edwards RDCS Referring Phys: [phone] Milad Bublitz B Jasiri [place]  IMPRESSIONS    1. The left ventricle has normal systolic function of 60-65%. The cavity size was normal. There is moderate concentric left ventricular hypertrophy. Left ventricular diastolic Doppler parameters are consistent with impaired relaxation.  2. The right ventricle has normal systolic function. The cavity was normal. There is no increase in right ventricular wall thickness. Right ventricular systolic pressure is mildly elevated with an estimated pressure of 38.2 mmHg.  3. Left atrial size was mildly dilated.  4. The mitral valve is normal in structure.  5. The tricuspid valve is normal in structure. Tricuspid valve regurgitation is moderate.  6. The aortic valve is tricuspid There is mild thickening and mild calcification of the aortic valve. There is mild aortic regurgitation.  7. There is moderate dilatation of the ascending aorta measuring 46 mm.  FINDINGS  Left Ventricle: The left ventricle has normal systolic function of 60-65%. The cavity size was normal. There is moderate concentric left ventricular hypertrophy. Left ventricular diastolic Doppler parameters are consistent with impaired relaxation  (grade I) Normal left  ventricular filling pressures Right Ventricle: The right ventricle has normal systolic function. The cavity was normal. There is no increase in right ventricular wall thickness. Right ventricular systolic pressure is mildly elevated with an estimated pressure of 38.2 mmHg. Left Atrium: left atrial size was mildly dilated Right Atrium: right atrial size was normal in size Interatrial Septum: No atrial level shunt detected by color flow Doppler. Pericardium: There is no evidence of pericardial effusion. Mitral Valve: The mitral valve is normal in structure. Mitral valve regurgitation is mild by color flow Doppler. Tricuspid Valve: The tricuspid valve is normal in structure. Tricuspid valve regurgitation is moderate by color flow Doppler. Aortic Valve: The aortic valve is tricuspid There is mild thickening and mild  calcification of the aortic valve. Aortic valve regurgitation was not visualized by color flow Doppler. Pulmonic Valve: The pulmonic valve was normal in structure. Pulmonic valve regurgitation is not visualized by color flow Doppler. Aorta: There is moderate dilatation of the ascending aorta measuring 47 mm. Venous: The inferior vena cava measures 1.40 cm, is normal in size with greater than 50% respiratory variability.   LEFT VENTRICLE PLAX 2D (Teich) LV EF:          64.0 %   Diastology LVIDd:          4.60 cm  LV e' lateral:   9.68 cm/s LVIDs:          3.00 cm  LV E/e' lateral: 6.5 LV PW:          1.10 cm  LV e' medial:    5.87 cm/s LV IVS:         1.40 cm  LV E/e' medial:  10.7 LVOT diam:      2.20 cm LV SV:          62 ml LVOT Area:      3.80 cm                            3D Volume EF:                          3D EF:        55 %                          LV EDV:       161 ml                          LV ESV:       72 ml                          LV SV:        88 ml  RIGHT VENTRICLE RV S prime:     11.50 cm/s TAPSE (M-mode): 2.3 cm RVSP:           38.2 mmHg  LEFT ATRIUM              Index       RIGHT ATRIUM           Index LA diam:        3.90 cm 1.71 cm/m  RA Pressure: 10 mmHg LA Vol (A2C):   69.9 ml 30.59 ml/m RA Area:     15.10 cm LA Vol (A4C):   63.7 ml 27.88 ml/m RA Volume:   35.20 ml  15.40 ml/m LA Biplane Vol: 74.8 ml 32.73 ml/m  AORTIC VALVE LVOT Vmax:   102.00 cm/s LVOT Vmean:  68.200 cm/s LVOT VTI:    0.220 m   AORTA Ao Root diam: 3.70 cm Ao Asc diam:  4.65 cm  MITRAL VALVE              TR Peak grad: 28.2 mmHg MV Area (PHT):            TR Vmax:      278.00 cm/s MV PHT:                   RVSP:  38.2 mmHg MV Decel Time: 301 msec MV E velocity: 62.60 cm/s MV A velocity: 57.00 cm/s MV E/A ratio:  1.10  IVC IVC diam: 1.40 cm    Tobias Alexander MD Electronically signed by Tobias Alexander MD Signature Date/Time: 03/23/2018/6:00:58 PM         Aortic Size Index=    4.8     /Body surface area is 2.3 meters squared. = 2.07  < 2.75 cm/m2      4% risk per year 2.75 to 4.25          8% risk per year > 4.25 cm/m2    20% risk per year    Assessment / Plan:   #1 fusiform dilatation of the ascending aorta, 4.7 to 4.8 cm by CT scan, suggested at 4.52 cm 5 years ago by echocardiogram-patient has no stigmata of connective tissue disorder, no family history of dissection or sudden death, echocardiogram 2020/02/26confirms trileaflet aortic valve.  #2  Unchanged 5 mm right middle lobe pulmonary nodule over the past 8 months.  #3  Coronary calcifications on CT noted, patient denies signs and symptoms of coronary disease, or angina  #4 hyperlipidemia with LDH of 129, intolerant of statins-cardiology managing According to the 2010 ACC/AHA guidelines, we recommend patients with thoracic aortic disease to maintain a LDL of less than 70 and a HDL of greater than 50. We recommend their blood pressure to remain less than 135/85.   Patient notes his blood pressures much better controlled at home, will plan to see him back in 8 months  with a follow-up CTA of the chest.  He is scheduled to see Dr. Eldridge Dace in December of this year  Delight Ovens MD      [address] [place] [place] [address] Office [phone]   Beeper [phone]  09/23/2018 4:28 PM

## 2019-01-12 ENCOUNTER — Other Ambulatory Visit: Payer: Self-pay | Admitting: Physician Assistant

## 2019-02-13 NOTE — Progress Notes (Signed)
Cardiology Office Note   Date:  02/15/2019   ID:  Daniel Petty, DOB [birth year], MRN [id number]  PCP:  Binnie Rail, MD    No chief complaint on file.  SVT  Wt Readings from Last 3 Encounters:  02/15/19 226 lb 6.4 oz (102.7 kg)  09/23/18 223 lb (101.2 kg)  03/16/18 225 lb (102.1 kg)       History of Present Illness: Daniel Petty is a 59 y.o. 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 He had syncope with HRs in the 200s prior to the ablation. Since the ablation, he has not had any episodes like this. Doing yardwork in the heat would cause these arrhythmias. He had a negative stress test.   Monitor in 2015 showed NSR with PACs, PVCs.   3/15: ETT negative ; 11METS 3/15 echo: normal LV function  He has had hyperlipidemia and was on Crestor but stopped this due to perceived muscle aches. He is watching his diet and the lipids are similar.  Pain did not improve quickly.    No family h/o CAD. No early MI in the family.  Follows with Dr. Servando Snare for Thoracic aneurysm, records from 2020: "fusiform dilatation of the ascending aorta, 4.7 to 4.8 cm by CT scan, suggested at 4.52 cm 5 years ago by echocardiogram-patient has no stigmata of connective tissue disorder, no family history of dissection or sudden death, echocardiogram 04-19-18 confirms trileaflet aortic valve."  Since the last visit, he has felt well.   Denies : Chest pain. Dizziness. Leg edema. Nitroglycerin use. Orthopnea. Palpitations. Paroxysmal nocturnal dyspnea. Shortness of breath. Syncope.   He is riding a bike for exercise.    BP at home is in the 115/75 range.  He has been eating unhealthy in the last few weeks with holidays.    In general he tries to eat healthy.  Avoids red meat.  In th epast, he was on Crestor but this did not help the LDL /HDL ratio.  He had bone aches.  No relief with CoQ 10.  Past Medical History:  Diagnosis Date  . Arthritis   . Ascending aorta  dilation (HCC)   . Hyperlipemia   . Hypertension   . Low testosterone   . Migraine   . Premature atrial contractions   . PSVT (paroxysmal supraventricular tachycardia) (HCC)    a. syncope/HR 200s -> SVT/AVNRT s/p ablation 2015.  Marland Kitchen PVC's (premature ventricular contractions)   . Syncope   . Tachycardia    6 [place] ago [place] in [place]    Past Surgical History:  Procedure Laterality Date  . APPENDECTOMY    . CHOLECYSTECTOMY    . psvt typical    . TONSILLECTOMY    . typical AVNRT       Current Outpatient Medications  Medication Sig Dispense Refill  . amLODipine (NORVASC) 5 MG tablet TAKE 1 TABLET(5 MG) BY MOUTH DAILY 90 tablet 3  . metoprolol succinate (TOPROL-XL) 25 MG 24 hr tablet TAKE 1 TABLET(25 MG) BY MOUTH DAILY 90 tablet 0  . SUMAtriptan (IMITREX) 25 MG tablet Take 1 tablet (25 mg total) by mouth every 2 (two) hours as needed for migraine. May repeat in 2 hours if headache persists or recurs. 10 tablet 11   No current facility-administered medications for this visit.    Allergies:   Penicillins, Quinolones, Statins, Yellow jacket venom, and Sulfa antibiotics    Social History:  The patient  reports that  he has never smoked. He has never used smokeless tobacco. He reports current alcohol use. He reports that he does not use drugs.   Family History:  The patient's family history includes Allergies in his father and mother; Arrhythmia in his mother; Heart attack in his mother; Heart disease in his father; Hyperlipidemia in his maternal grandmother and mother; Multiple sclerosis in his brother and mother.    ROS:  Please see the history of present illness.   Otherwise, review of systems are positive for intolerance with Crestor in the past.   All other systems are reviewed and negative.    PHYSICAL EXAM: VS:  BP 130/88   Pulse 66   Ht 6\' 2"  (1.88 m)   Wt 226 lb 6.4 oz (102.7 kg)   SpO2 96%   BMI 29.07 kg/m  , BMI Body mass index is 29.07 kg/m. GEN: Well nourished,  well developed, in no acute distress  HEENT: normal  Neck: no JVD, carotid bruits, or masses Cardiac: RRR; no murmurs, rubs, or gallops,no edema  Respiratory:  clear to auscultation bilaterally, normal work of breathing GI: soft, nontender, nondistended, + BS MS: no deformity or atrophy  Skin: warm and dry, no rash Neuro:  Strength and sensation are intact Psych: euthymic mood, full affect   EKG:   The ekg ordered today demonstrates Normal ECG   Recent Labs: 02/17/2018: ALT 23; BUN 14; Creatinine, Ser 0.73; Potassium 4.2; Sodium 140   Lipid Panel    Component Value Date/Time   CHOL 204 (H) 02/17/2018 1440   TRIG 195.0 (H) 02/17/2018 1440   HDL 36.40 (L) 02/17/2018 1440   CHOLHDL 6 02/17/2018 1440   VLDL 39.0 02/17/2018 1440   LDLCALC 129 (H) 02/17/2018 1440     Other studies Reviewed: Additional studies/ records that were reviewed today with results demonstrating: LDL 129.   ASSESSMENT AND PLAN:  1. SVT: s/p ablation in 04/18/2018.  No palpitations.  2. Thoracic aortic aneurysm: As noted above.  Avoid heavy lifting.  BP control.  COntinue metorpolol.  WIll refill today.  3. Coronary calcifications: Noted by CT.  He has lost weight in the past to be healthier.  Intolerant of Crestor.  Zocor did not work for him.  WOuld be willing to try Zetia.     Current medicines are reviewed at length with the patient today.  The patient concerns regarding his medicines were addressed.  The following changes have been made:  No change  Labs/ tests ordered today include: CBC, CMet, lipids No orders of the defined types were placed in this encounter.   Recommend 150 minutes/week of aerobic exercise Low fat, low carb, high fiber diet recommended  Disposition:   FU in 1 year   Signed, [place], MD  02/15/2019 8:34 AM    [facility] [address] [place], [place], [place]  [place] Phone: [phone]; Fax: [phone]

## 2019-02-15 ENCOUNTER — Ambulatory Visit (INDEPENDENT_AMBULATORY_CARE_PROVIDER_SITE_OTHER): Payer: Managed Care, Other (non HMO) | Admitting: Interventional Cardiology

## 2019-02-15 ENCOUNTER — Encounter: Payer: Self-pay | Admitting: Interventional Cardiology

## 2019-02-15 ENCOUNTER — Other Ambulatory Visit: Payer: Self-pay

## 2019-02-15 VITALS — BP 130/88 | HR 66 | Ht 74.0 in | Wt 226.4 lb

## 2019-02-15 DIAGNOSIS — I712 Thoracic aortic aneurysm, without rupture, unspecified: Secondary | ICD-10-CM

## 2019-02-15 DIAGNOSIS — I493 Ventricular premature depolarization: Secondary | ICD-10-CM

## 2019-02-15 DIAGNOSIS — I471 Supraventricular tachycardia, unspecified: Secondary | ICD-10-CM

## 2019-02-15 DIAGNOSIS — I491 Atrial premature depolarization: Secondary | ICD-10-CM

## 2019-02-15 DIAGNOSIS — I10 Essential (primary) hypertension: Secondary | ICD-10-CM

## 2019-02-15 LAB — LIPID PANEL
Chol/HDL Ratio: 5.6 ratio — ABNORMAL HIGH (ref 0.0–5.0)
Cholesterol, Total: 192 mg/dL (ref 100–199)
HDL: 34 mg/dL — ABNORMAL LOW
LDL Chol Calc (NIH): 130 mg/dL — ABNORMAL HIGH (ref 0–99)
Triglycerides: 157 mg/dL — ABNORMAL HIGH (ref 0–149)
VLDL Cholesterol Cal: 28 mg/dL (ref 5–40)

## 2019-02-15 LAB — COMPREHENSIVE METABOLIC PANEL WITH GFR
ALT: 21 IU/L (ref 0–44)
AST: 21 IU/L (ref 0–40)
Albumin/Globulin Ratio: 2 (ref 1.2–2.2)
Albumin: 4.5 g/dL (ref 3.8–4.9)
Alkaline Phosphatase: 85 IU/L (ref 39–117)
BUN/Creatinine Ratio: 17 (ref 10–24)
BUN: 13 mg/dL (ref 8–27)
Bilirubin Total: 0.6 mg/dL (ref 0.0–1.2)
CO2: 24 mmol/L (ref 20–29)
Calcium: 9.3 mg/dL (ref 8.6–10.2)
Chloride: 103 mmol/L (ref 96–106)
Creatinine, Ser: 0.77 mg/dL (ref 0.76–1.27)
GFR calc Af Amer: 114 mL/min/1.73
GFR calc non Af Amer: 99 mL/min/1.73
Globulin, Total: 2.2 g/dL (ref 1.5–4.5)
Glucose: 100 mg/dL — ABNORMAL HIGH (ref 65–99)
Potassium: 4.6 mmol/L (ref 3.5–5.2)
Sodium: 141 mmol/L (ref 134–144)
Total Protein: 6.7 g/dL (ref 6.0–8.5)

## 2019-02-15 LAB — CBC
Hematocrit: 44.8 % (ref 37.5–51.0)
Hemoglobin: 15.4 g/dL (ref 13.0–17.7)
MCH: 30.9 pg (ref 26.6–33.0)
MCHC: 34.4 g/dL (ref 31.5–35.7)
MCV: 90 fL (ref 79–97)
Platelets: 231 x10E3/uL (ref 150–450)
RBC: 4.98 x10E6/uL (ref 4.14–5.80)
RDW: 12.6 % (ref 11.6–15.4)
WBC: 8.9 x10E3/uL (ref 3.4–10.8)

## 2019-02-15 MED ORDER — METOPROLOL SUCCINATE ER 25 MG PO TB24: ORAL_TABLET | ORAL | 3 refills | Status: DC

## 2019-02-15 MED ORDER — AMLODIPINE BESYLATE 5 MG PO TABS: ORAL_TABLET | ORAL | 3 refills | Status: DC

## 2019-02-15 NOTE — Patient Instructions (Signed)
Medication Instructions:  Your physician recommends that you continue on your current medications as directed. Please refer to the Current Medication list given to you today.  *If you need a refill on your cardiac medications before your next appointment, please call your pharmacy*  Lab Work: TODAY: CBC, CMET, LIPIDS  If you have labs (blood work) drawn today and your tests are completely normal, you will receive your results only by: . MyChart Message (if you have MyChart) OR . A paper copy in the mail If you have any lab test that is abnormal or we need to change your treatment, we will call you to review the results.  Testing/Procedures: None ordered  Follow-Up: At [facility], you and your health needs are our priority.  As part of our continuing mission to provide you with exceptional heart care, we have created designated Provider Care Teams.  These Care Teams include your primary Cardiologist (physician) and Advanced Practice Providers (APPs -  Physician Assistants and Nurse Practitioners) who all work together to provide you with the care you need, when you need it.  Your next appointment:   12 month(s)  The format for your next appointment:   In Person  Provider:   You may see Jayadeep Varanasi, MD or one of the following Advanced Practice Providers on your designated Care Team:    Dayna Dunn, PA-C  Michele Lenze, PA-C   Other Instructions   

## 2019-04-15 ENCOUNTER — Other Ambulatory Visit: Payer: Self-pay | Admitting: Physician Assistant

## 2019-04-20 ENCOUNTER — Other Ambulatory Visit: Payer: Self-pay | Admitting: Cardiothoracic Surgery

## 2019-04-20 DIAGNOSIS — I712 Thoracic aortic aneurysm, without rupture, unspecified: Secondary | ICD-10-CM

## 2019-06-02 ENCOUNTER — Encounter: Payer: Self-pay | Admitting: Cardiothoracic Surgery

## 2019-06-02 ENCOUNTER — Other Ambulatory Visit: Payer: Self-pay

## 2019-06-02 ENCOUNTER — Ambulatory Visit
Admission: RE | Admit: 2019-06-02 | Discharge: 2019-06-02 | Disposition: A | Discharge status: Home / Self Care | Payer: Managed Care, Other (non HMO) | Source: Ambulatory Visit | Attending: Cardiothoracic Surgery | Admitting: Cardiothoracic Surgery

## 2019-06-02 ENCOUNTER — Ambulatory Visit (INDEPENDENT_AMBULATORY_CARE_PROVIDER_SITE_OTHER): Payer: Managed Care, Other (non HMO) | Admitting: Cardiothoracic Surgery

## 2019-06-02 VITALS — BP 160/99 | HR 64 | Temp 97.6°F | Resp 20 | Ht 74.0 in | Wt 230.0 lb

## 2019-06-02 DIAGNOSIS — I351 Nonrheumatic aortic (valve) insufficiency: Secondary | ICD-10-CM | POA: Diagnosis not present

## 2019-06-02 DIAGNOSIS — I712 Thoracic aortic aneurysm, without rupture, unspecified: Secondary | ICD-10-CM

## 2019-06-02 DIAGNOSIS — I7121 Aneurysm of the ascending aorta, without rupture: Secondary | ICD-10-CM

## 2019-06-02 MED ORDER — IOPAMIDOL (ISOVUE-370) INJECTION 76%
75.0000 mL | Freq: Once | INTRAVENOUS | Status: AC | PRN
  Administered 2019-06-02: 75 mL via INTRAVENOUS

## 2019-06-02 NOTE — Progress Notes (Signed)
[address]       [place] [address]             [phone]                    Koji Niehoff [facility] Medical Record #[id number] Date of Birth: 07-08-1960  Referring: Laurann Montana, PA-C Primary Care: Pincus Sanes, MD Primary Cardiologist: Lance Muss, MD  Chief Complaint:    Chief Complaint  Patient presents with  . Thoracic Aortic Aneurysm    8 month f/u with CTA Chest    History of Present Illness:    Daniel Petty 59 y.o. male returns to the office  today for follow up evaluation of a dilated ascending aorta.  The patient originally presented to [facility] in [place] after multiple episodes of syncope with supraventricular tachycardia.  He notes this was usually associated with working in his yard and excessive heat.  He underwent ablation in [place] and has had no further problems with supraventricular tachycardia.  March 2015 ,  September 2015 echocardiograms were done while he was living in [place], one mention normal aortic size and a trileaflet aortic valve, the second echo noted ascending aorta to be 4.52 cm, with trace to mild aortic insufficiency, no mention if it was a bicuspid or trileaflet aortic valve.   Patient has no history of coronary occlusive disease.  He has not tolerated statins in the past.  He noted the main symptoms were overall fatigue and muscle discomfort.   On routine visit to cardiology office the findings of his echocardiogram in 2015 were noted and it was recommended that he obtain a CT scan of his chest which was done and the patient referred to the surgical office.   His family history is significant for the lack of history of dissections, dilated ascending aorta's.  His mother has had a myocardial infarction, father has had a pacemaker defibrillator placed, both his mother and brother have multiple sclerosis.  A maternal grandmother died at age 31 of a myocardial infarction, maternal grandfather died  of cranial hemorrhage at a young age.  The  patient and his wife have 2 children who are healthy.   Patient notes he has had Covid vaccination-   Current Activity/ Functional Status:  Patient is independent with mobility/ambulation, transfers, ADL's, IADL's.   Zubrod Score: At the time of surgery this patient's most appropriate activity status/level should be described as: [x]     0    Normal activity, no symptoms []     1    Restricted in physical strenuous activity but ambulatory, able to do out light work []     2    Ambulatory and capable of self care, unable to do work activities, up and about               >50 % of waking hours                              []     3    Only limited self care, in bed greater than 50% of waking hours []     4    Completely disabled, no self care, confined to bed or chair []     5    Moribund   Past Medical History:  Diagnosis Date  . Arthritis   . Ascending aorta dilation (HCC)   . Hyperlipemia   .  Hypertension   . Low testosterone   . Migraine   . Premature atrial contractions   . PSVT (paroxysmal supraventricular tachycardia) (HCC)    a. syncope/HR 200s -> SVT/AVNRT s/p ablation 2015.  Marland Kitchen. PVC's (premature ventricular contractions)   . Syncope   . Tachycardia    6 [place] in [place]    Past Surgical History:  Procedure Laterality Date  . APPENDECTOMY    . CHOLECYSTECTOMY    . psvt typical    . TONSILLECTOMY    . typical AVNRT      Family History  Problem Relation Age of Onset  . Hyperlipidemia Mother   . Heart attack Mother   . Multiple sclerosis Mother   . Arrhythmia Mother   . Allergies Mother   . Heart disease Father   . Allergies Father   . Multiple sclerosis Brother   . Hyperlipidemia Maternal Grandmother      Social History   Tobacco Use  Smoking Status Never Smoker  Smokeless Tobacco Never Used    Social History   Substance and Sexual Activity  Alcohol Use Yes     Allergies  Allergen Reactions  .  Penicillins Anaphylaxis  . Quinolones     Patient was warned about not using Cipro and similar antibiotics. Recent studies have raised concern that fluoroquinolone antibiotics could be associated with an increased risk of aortic aneurysm Fluoroquinolones have non-antimicrobial properties that might jeopardise the integrity of the extracellular matrix of the vascular wall In a  propensity score matched cohort study in [place], there was a 66% increased rate of aortic aneurysm or dissection associated with oral fluoroquinolone use, compared wit  . Statins     Cognitive impairment muscle pain  . [place] Venom   . Sulfa Antibiotics Rash    Current Outpatient Medications  Medication Sig Dispense Refill  . amLODipine (NORVASC) 5 MG tablet TAKE 1 TABLET(5 MG) BY MOUTH DAILY 90 tablet 3  . metoprolol succinate (TOPROL-XL) 25 MG 24 hr tablet TAKE 1 TABLET(25 MG) BY MOUTH DAILY 90 tablet 3  . SUMAtriptan (IMITREX) 25 MG tablet Take 1 tablet (25 mg total) by mouth every 2 (two) hours as needed for migraine. May repeat in 2 hours if headache persists or recurs. 10 tablet 11   No current facility-administered medications for this visit.    Pertinent items are noted in HPI.   Review of Systems:     Cardiac Review of Systems: [Y] = yes  or   [ N ] = no   Chest Pain [n  ]  Resting SOB [n  ] Exertional SOB  [n]  Orthopnea [n  ]   Pedal Edema [ n  ]    Palpitations [ n ] Syncope  [Y associated with svt , but none since ablation   ]   Presyncope [  n ]   General Review of Systems: [Y] = yes [  ]=no Constitional: recent weight change [  ];  Wt loss over the last 3 months [   ] anorexia [  ]; fatigue [  ]; nausea [  ]; night sweats [  ]; fever [  ]; or chills [  ];           Eye : blurred vision [  ]; diplopia [   ]; vision changes [  ];  Amaurosis fugax[  ]; Resp: cough [  ];  wheezing[  ];  hemoptysis[  ]; shortness of breath[  ]; paroxysmal  nocturnal dyspnea[  ]; dyspnea on exertion[  ]; or  orthopnea[  ];  GI:  gallstones[  ], vomiting[  ];  dysphagia[  ]; melena[  ];  hematochezia [  ]; heartburn[  ];   Hx of  Colonoscopy[  ]; GU: kidney stones [  ]; hematuria[  ];   dysuria [  ];  nocturia[  ];  history of     obstruction [  ]; urinary frequency [  ]             Skin: rash, swelling[  ];, hair loss[  ];  peripheral edema[  ];  or itching[  ]; Musculosketetal: myalgias[  ];  joint swelling[  ];  joint erythema[  ];  joint pain[  ];  back pain[  ];  Heme/Lymph: bruising[  ];  bleeding[  ];  anemia[  ];  Neuro: TIA[  ];  headaches[ y with migraine symptoms and visual changes right eye has had for years  ];  stroke[  ];  vertigo[  ];  seizures[  ];   paresthesias[  ];  difficulty walking[  ];  Psych:depression[  ]; anxiety[  ];  Endocrine: diabetes[  ];  thyroid dysfunction[  ];  Immunizations: Flu up to date [place].Etienne ]; Pneumococcal up to date [ n];  Other:     PHYSICAL EXAMINATION: BP (!) 160/99   Pulse 64   Temp 97.6 F (36.4 C) (Skin)   Resp 20   Ht  (1.88 m)   Wt 230 lb (104.3 kg)   SpO2 98% Comment: RA  BMI 29.53 kg/m  General appearance: alert, cooperative and no distress Head: Normocephalic, without obvious abnormality, atraumatic Neck: no adenopathy, no carotid bruit, no JVD, supple, symmetrical, trachea midline and thyroid not enlarged, symmetric, no tenderness/mass/nodules Lymph nodes: Cervical, supraclavicular, and axillary nodes normal. Resp: clear to auscultation bilaterally Cardio: regular rate and rhythm, S1, S2 normal, no murmur, click, rub or gallop GI: soft, non-tender; bowel sounds normal; no masses,  no organomegaly Extremities: extremities normal, atraumatic, no cyanosis or edema Neurologic: Grossly normal No physical stigmata to suggest congenital connective tissue disorder Patient noted he had just come from the CT scan and did not take his blood pressure medicine yet today.  Diagnostic Studies & Laboratory data:     Recent Radiology  Findings:  CT ANGIO CHEST AORTA W/CM & OR WO/CM  Result Date: 06/02/2019 CLINICAL DATA:  Thoracic aortic aneurysm. EXAM: CT ANGIOGRAPHY CHEST WITH CONTRAST TECHNIQUE: Multidetector CT imaging of the chest was performed using the standard protocol during bolus administration of intravenous contrast. Multiplanar CT image reconstructions and MIPs were obtained to evaluate the vascular anatomy. CONTRAST:  75mL ISOVUE-370 IOPAMIDOL (ISOVUE-370) INJECTION 76% COMPARISON:  September 23, 2018. FINDINGS: Cardiovascular: Grossly stable 4.6 cm ascending thoracic aortic aneurysm is noted. No dissection is noted. Great vessels are widely patent. Transverse aortic arch measures 3 cm in diameter. Proximal descending thoracic aorta measures 2.7 cm in diameter. Normal cardiac size. No pericardial effusion. Mediastinum/Nodes: No enlarged mediastinal, hilar, or axillary lymph nodes. Thyroid gland, trachea, and esophagus demonstrate no significant findings. Lungs/Pleura: Lungs are clear. No pleural effusion or pneumothorax. Upper Abdomen: No acute abnormality. Musculoskeletal: No chest wall abnormality. No acute or significant osseous findings. Review of the MIP images confirms the above findings. IMPRESSION: Grossly stable 4.6 cm ascending thoracic aortic aneurysm. No dissection is noted. Ascending thoracic aortic aneurysm. Recommend semi-annual imaging followup by CTA or MRA and referral to cardiothoracic surgery if not already obtained.  This recommendation follows 2010 ACCF/AHA/AATS/ACR/ASA/SCA/SCAI/SIR/STS/SVM Guidelines for the Diagnosis and Management of Patients With Thoracic Aortic Disease. Circulation. 2010; 121: W295-A213. Aortic aneurysm NOS (ICD10-I71.9). Aortic Atherosclerosis (ICD10-I70.0). Electronically Signed   By: Lupita Raider M.D.   On: 06/02/2019 14:07   Ct Angio Chest Aorta W &/or Wo Contrast  Result Date: 09/23/2018 CLINICAL DATA:  Follow-up known thoracic aortic aneurysm. EXAM: CT ANGIOGRAPHY CHEST WITH  CONTRAST TECHNIQUE: Multidetector CT imaging of the chest was performed using the standard protocol during bolus administration of intravenous contrast. Multiplanar CT image reconstructions and MIPs were obtained to evaluate the vascular anatomy. Creatinine was obtained on site at [facility] at [address] Results: Creatinine 0.7 mg/dL. CONTRAST:  10mL ISOVUE-370 IOPAMIDOL (ISOVUE-370) INJECTION 76% COMPARISON:  01/28/2018 FINDINGS: Cardiovascular: Fusiform aneurysmal dilatation of the ascending aorta, maximal dimension 4.7 cm (measured image 80 series 5), previously 4.8 cm. Measurement differences likely due to caliper placement. Slight tortuosity of the descending aorta. No aortic dissection. Trace atherosclerosis of the aortic arch. Conventional branching pattern from the aortic arch. Heart is normal in size. There are coronary artery calcifications. No pericardial effusion. There are no filling defects in the central pulmonary arteries. Mediastinum/Nodes: No suspicion mediastinal or hilar adenopathy. Few calcified lymph nodes lower mediastinum and right hilum consistent with prior granulomatous disease. Visualized thyroid gland is unremarkable. The esophagus is decompressed. Lungs/Pleura: 5 mm right middle lobe pulmonary nodule, image 93 series 7, unchanged from prior exam. Calcified granuloma in the right upper lobe. The lungs are otherwise clear. Focal airspace disease or pleural fluid. Upper Abdomen: No acute abnormality. Musculoskeletal: There are no acute or suspicious osseous abnormalities. Scattered incidental vertebral body hemangiomas. Review of the MIP images confirms the above findings. IMPRESSION: 1. Stable fusiform aneurysmal dilatation of the ascending aorta allowing for differences in caliper placement, maximal dimension 4.7 cm. No dissection or acute aortic abnormality. Recommend semi-annual imaging followup by CTA or MRA and referral to cardiothoracic surgery if not already  obtained. This recommendation follows 2010 ACCF/AHA/AATS/ACR/ASA/SCA/SCAI/SIR/STS/SVM Guidelines for the Diagnosis and Management of Patients With Thoracic Aortic Disease. Circulation. 2010; 121: Y865-H846. Aortic aneurysm NOS (ICD10-I71.9) 2. Unchanged 5 mm right middle lobe pulmonary nodule over the past 8 months. 3. Coronary artery calcifications. Aortic Atherosclerosis (ICD10-I70.0). Electronically Signed   By: Narda Rutherford M.D.   On: 09/23/2018 15:08   I have independently reviewed the above radiology studies  and reviewed the findings with the patient.    CLINICAL DATA:  59 year old male with a history of thoracic aortic aneurysm  EXAM: CT ANGIOGRAPHY CHEST WITH CONTRAST  TECHNIQUE: Multidetector CT imaging of the chest was performed using the standard protocol during bolus administration of intravenous contrast. Multiplanar CT image reconstructions and MIPs were obtained to evaluate the vascular anatomy.  CONTRAST:  ISOVUE-370 IOPAMIDOL (ISOVUE-370) INJECTION 76%  COMPARISON:  Prior chest x-ray 04/30/2017  FINDINGS: Cardiovascular: Fusiform aneurysmal dilatation of the tubular portion of the ascending thoracic aorta with a maximal diameter of 4.8 cm. No evidence of dissection. Conventional 3 vessel arch anatomy. Calcifications present along the coronary arteries. The heart is normal in size. No pericardial effusion. Unremarkable pulmonary venous drainage.  Mediastinum/Nodes: Unremarkable CT appearance of the thyroid gland. No suspicious mediastinal or hilar adenopathy. No soft tissue mediastinal mass. The thoracic esophagus is unremarkable. There are a few small calcified right-sided hilar lymph nodes suggesting old granulomatous disease.  Lungs/Pleura: The lungs are largely clear. However, there is a solitary 5 mm pulmonary nodule within the right middle lobe (  image 61, series 5). No evidence of emphysema, bronchial wall thickening, pulmonary edema,  pneumothorax or pleural effusion.  Upper Abdomen: No acute abnormality.  Musculoskeletal: No acute fracture or aggressive appearing lytic or blastic osseous lesion.  Review of the MIP images confirms the above findings.  IMPRESSION: 1. Fusiform aneurysmal dilatation of the ascending thoracic aorta with a maximal diameter of 4.8 cm. Ascending thoracic aortic aneurysm. Recommend semi-annual imaging followup by CTA or MRA and referral to cardiothoracic surgery if not already obtained. This recommendation follows 2010 ACCF/AHA/AATS/ACR/ASA/SCA/SCAI/SIR/STS/SVM Guidelines for the Diagnosis and Management of Patients With Thoracic Aortic Disease. Circulation. 2010; 121: S854-O270. Aortic aneurysm NOS (ICD10-I71.9); Aortic Atherosclerosis (ICD10-170.0) 2. Small 5 mm right middle lobe pulmonary nodule. No follow-up needed if patient is low-risk. Non-contrast chest CT can be considered in 12 months if patient is high-risk. This recommendation follows the consensus statement: Guidelines for Management of Incidental Pulmonary Nodules Detected on CT Images: From the Fleischner Society 2017; Radiology 2017; 284:228-243. 3. Coronary artery calcifications.  Signed,  Sterling Big, MD, RPVI  [facility]  [facility]   Electronically Signed   By: Malachy Moan M.D.   On: 01/28/2018 16:45  Recent Lab Findings: Lab Results  Component Value Date   WBC 8.9 02/15/2019   HGB 15.4 02/15/2019   HCT 44.8 02/15/2019   PLT 231 02/15/2019   GLUCOSE 100 (H) 02/15/2019   CHOL 192 02/15/2019   TRIG 157 (H) 02/15/2019   HDL 34 (L) 02/15/2019   LDLCALC 130 (H) 02/15/2019   ALT 21 02/15/2019   AST 21 02/15/2019   NA 141 02/15/2019   K 4.6 02/15/2019   CL 103 02/15/2019   CREATININE 0.77 02/15/2019   BUN 13 02/15/2019   CO2 24 02/15/2019   TSH 1.92 09/11/2016   echocardiogram Result status: Final result    ECHOCARDIOGRAM  REPORT       Patient Name:   [facility] Date of Exam: 03/23/2018 Medical Rec #:  [id number]        Height:       74.0 in Accession #:    [id number]       Weight:       225.0 lb Date of Birth:  1960/12/29         BSA:          2.29 m Patient Age:    59 years         BP:           128/88 mmHg Patient Gender: M                HR:           68 bpm. Exam Location:  [address]    Procedure: 2D Echo, 3D Echo, Cardiac Doppler and Color Doppler  Indications:    I35.1 AI   History:        Patient has prior history of Echocardiogram examinations, most                 recent 10/25/2013. PVCs,PACs, AI; Risk Factors: Hypertension and                 Dyslipidemia; Medications: Dilated ascending aorta.   Sonographer:    Samule Ohm RDCS Referring Phys: [phone] Tyberius Ryner B Amrie Gurganus  IMPRESSIONS    1. The left ventricle has normal systolic function of 60-65%. The cavity size was normal. There is moderate concentric left ventricular hypertrophy. Left ventricular diastolic Doppler parameters are  consistent with impaired relaxation.  2. The right ventricle has normal systolic function. The cavity was normal. There is no increase in right ventricular wall thickness. Right ventricular systolic pressure is mildly elevated with an estimated pressure of 38.2 mmHg.  3. Left atrial size was mildly dilated.  4. The mitral valve is normal in structure.  5. The tricuspid valve is normal in structure. Tricuspid valve regurgitation is moderate.  6. The aortic valve is tricuspid There is mild thickening and mild calcification of the aortic valve. There is mild aortic regurgitation.  7. There is moderate dilatation of the ascending aorta measuring 46 mm.  FINDINGS  Left Ventricle: The left ventricle has normal systolic function of 43-15%. The cavity size was normal. There is moderate concentric left ventricular hypertrophy. Left ventricular diastolic Doppler parameters are consistent with impaired  relaxation  (grade I) Normal left ventricular filling pressures Right Ventricle: The right ventricle has normal systolic function. The cavity was normal. There is no increase in right ventricular wall thickness. Right ventricular systolic pressure is mildly elevated with an estimated pressure of 38.2 mmHg. Left Atrium: left atrial size was mildly dilated Right Atrium: right atrial size was normal in size Interatrial Septum: No atrial level shunt detected by color flow Doppler. Pericardium: There is no evidence of pericardial effusion. Mitral Valve: The mitral valve is normal in structure. Mitral valve regurgitation is mild by color flow Doppler. Tricuspid Valve: The tricuspid valve is normal in structure. Tricuspid valve regurgitation is moderate by color flow Doppler. Aortic Valve: The aortic valve is tricuspid There is mild thickening and mild calcification of the aortic valve. Aortic valve regurgitation was not visualized by color flow Doppler. Pulmonic Valve: The pulmonic valve was normal in structure. Pulmonic valve regurgitation is not visualized by color flow Doppler. Aorta: There is moderate dilatation of the ascending aorta measuring 47 mm. Venous: The inferior vena cava measures 1.40 cm, is normal in size with greater than 50% respiratory variability.   LEFT VENTRICLE PLAX 2D (Teich) LV EF:          64.0 %   Diastology LVIDd:          4.60 cm  LV e' lateral:   9.68 cm/s LVIDs:          3.00 cm  LV E/e' lateral: 6.5 LV PW:          1.10 cm  LV e' medial:    5.87 cm/s LV IVS:         1.40 cm  LV E/e' medial:  10.7 LVOT diam:      2.20 cm LV SV:          62 ml LVOT Area:      3.80 cm                            3D Volume EF:                          3D EF:        55 %                          LV EDV:       161 ml                          LV ESV:  72 ml                          LV SV:        88 ml  RIGHT VENTRICLE RV S prime:     11.50 cm/s TAPSE (M-mode): 2.3 cm RVSP:            38.2 mmHg  LEFT ATRIUM             Index       RIGHT ATRIUM           Index LA diam:        3.90 cm 1.71 cm/m  RA Pressure: 10 mmHg LA Vol (A2C):   69.9 ml 30.59 ml/m RA Area:     15.10 cm LA Vol (A4C):   63.7 ml 27.88 ml/m RA Volume:   35.20 ml  15.40 ml/m LA Biplane Vol: 74.8 ml 32.73 ml/m  AORTIC VALVE LVOT Vmax:   102.00 cm/s LVOT Vmean:  68.200 cm/s LVOT VTI:    0.220 m   AORTA Ao Root diam: 3.70 cm Ao Asc diam:  4.65 cm  MITRAL VALVE              TR Peak grad: 28.2 mmHg MV Area (PHT):            TR Vmax:      278.00 cm/s MV PHT:                   RVSP:         38.2 mmHg MV Decel Time: 301 msec MV E velocity: 62.60 cm/s MV A velocity: 57.00 cm/s MV E/A ratio:  1.10  IVC IVC diam: 1.40 cm    Tobias Alexander MD Electronically signed by Tobias Alexander MD Signature Date/Time: 03/23/2018/6:00:58 PM         Aortic Size Index=    4.6    /Body surface area is 2.33 meters squared. = 1.97  < 2.75 cm/m2      4% risk per year 2.75 to 4.25          8% risk per year > 4.25 cm/m2    20% risk per year    Assessment / Plan:   #1 fusiform dilatation of the ascending aorta, 4.6-4.7  cm by CT scan, suggested at 4.52 cm 5 years ago by echocardiogram-patient has no stigmata of connective tissue disorder, no family history of dissection or sudden death, echocardiogram 02/22/20confirms trileaflet aortic valve. Plan see the patient back in 12 months with a follow-up CTA of the chest  #2  Coronary calcifications on CT noted, patient denies signs and symptoms of coronary disease, or angina  #3  hyperlipidemia with LDH of 129, intolerant of statins-cardiology managing According to the 2010 ACC/AHA guidelines, we recommend patients with thoracic aortic disease to maintain a LDL of less than 70 and a HDL of greater than 50. We recommend their blood pressure to remain less than 135/85.   Encourage patient to treat his blood pressure medication as soon as he gets  home and will report back blood pressure.  Delight Ovens MD      [address] [place] [place] [address] Office [phone]   Beeper [phone]  06/02/2019 2:43 PM

## 2019-06-08 ENCOUNTER — Encounter: Payer: Self-pay | Admitting: Internal Medicine

## 2019-06-12 ENCOUNTER — Emergency Department (HOSPITAL_COMMUNITY): Payer: Managed Care, Other (non HMO)

## 2019-06-12 ENCOUNTER — Other Ambulatory Visit: Payer: Self-pay

## 2019-06-12 ENCOUNTER — Encounter (HOSPITAL_COMMUNITY): Payer: Self-pay | Admitting: Emergency Medicine

## 2019-06-12 ENCOUNTER — Emergency Department (HOSPITAL_COMMUNITY)
Admission: EM | Admit: 2019-06-12 | Discharge: 2019-06-12 | Disposition: A | Discharge status: Home / Self Care | Payer: Managed Care, Other (non HMO) | Attending: Emergency Medicine | Admitting: Emergency Medicine

## 2019-06-12 DIAGNOSIS — J9801 Acute bronchospasm: Secondary | ICD-10-CM | POA: Diagnosis not present

## 2019-06-12 DIAGNOSIS — G43909 Migraine, unspecified, not intractable, without status migrainosus: Secondary | ICD-10-CM | POA: Insufficient documentation

## 2019-06-12 DIAGNOSIS — B349 Viral infection, unspecified: Secondary | ICD-10-CM | POA: Diagnosis not present

## 2019-06-12 DIAGNOSIS — Z20822 Contact with and (suspected) exposure to covid-19: Secondary | ICD-10-CM | POA: Insufficient documentation

## 2019-06-12 DIAGNOSIS — R519 Headache, unspecified: Secondary | ICD-10-CM | POA: Diagnosis present

## 2019-06-12 DIAGNOSIS — I10 Essential (primary) hypertension: Secondary | ICD-10-CM | POA: Insufficient documentation

## 2019-06-12 LAB — CBC WITH DIFFERENTIAL/PLATELET
Abs Immature Granulocytes: 0.03 10*3/uL (ref 0.00–0.07)
Basophils Absolute: 0.1 10*3/uL (ref 0.0–0.1)
Basophils Relative: 1 %
Eosinophils Absolute: 0 10*3/uL (ref 0.0–0.5)
Eosinophils Relative: 1 %
HCT: 45.5 % (ref 39.0–52.0)
Hemoglobin: 15.2 g/dL (ref 13.0–17.0)
Immature Granulocytes: 1 %
Lymphocytes Relative: 39 %
Lymphs Abs: 2.1 10*3/uL (ref 0.7–4.0)
MCH: 30.3 pg (ref 26.0–34.0)
MCHC: 33.4 g/dL (ref 30.0–36.0)
MCV: 90.6 fL (ref 80.0–100.0)
Monocytes Absolute: 0.6 10*3/uL (ref 0.1–1.0)
Monocytes Relative: 11 %
Neutro Abs: 2.5 10*3/uL (ref 1.7–7.7)
Neutrophils Relative %: 47 %
Platelets: 128 10*3/uL — ABNORMAL LOW (ref 150–400)
RBC: 5.02 MIL/uL (ref 4.22–5.81)
RDW: 13.3 % (ref 11.5–15.5)
WBC: 5.4 10*3/uL (ref 4.0–10.5)
nRBC: 0 % (ref 0.0–0.2)

## 2019-06-12 LAB — RESPIRATORY PANEL BY RT PCR (FLU A&B, COVID)
Influenza A by PCR: NEGATIVE
Influenza B by PCR: NEGATIVE
SARS Coronavirus 2 by RT PCR: NEGATIVE

## 2019-06-12 LAB — BASIC METABOLIC PANEL
Anion gap: 9 (ref 5–15)
BUN: 13 mg/dL (ref 6–20)
CO2: 25 mmol/L (ref 22–32)
Calcium: 8.7 mg/dL — ABNORMAL LOW (ref 8.9–10.3)
Chloride: 104 mmol/L (ref 98–111)
Creatinine, Ser: 0.89 mg/dL (ref 0.61–1.24)
GFR calc Af Amer: >60 mL/min (ref >60)
GFR calc non Af Amer: >60 mL/min (ref >60)
Glucose, Bld: 105 mg/dL — ABNORMAL HIGH (ref 70–99)
Potassium: 3.7 mmol/L (ref 3.5–5.1)
Sodium: 138 mmol/L (ref 135–145)

## 2019-06-12 LAB — URINALYSIS, ROUTINE W REFLEX MICROSCOPIC
Bilirubin Urine: NEGATIVE
Glucose, UA: NEGATIVE mg/dL
Hgb urine dipstick: NEGATIVE
Ketones, ur: NEGATIVE mg/dL
Leukocytes,Ua: NEGATIVE
Nitrite: NEGATIVE
Protein, ur: NEGATIVE mg/dL
Specific Gravity, Urine: 1.03 (ref 1.005–1.030)
pH: 5 (ref 5.0–8.0)

## 2019-06-12 MED ORDER — PROCHLORPERAZINE EDISYLATE 10 MG/2ML IJ SOLN
10.0000 mg | Freq: Once | INTRAMUSCULAR | Status: AC
  Administered 2019-06-12: 10 mg via INTRAVENOUS
  Filled 2019-06-12: qty 2

## 2019-06-12 MED ORDER — ALBUTEROL SULFATE HFA 108 (90 BASE) MCG/ACT IN AERS
2.0000 | INHALATION_SPRAY | RESPIRATORY_TRACT | 0 refills | Status: DC | PRN
Start: 2019-06-12 — End: 2020-02-13

## 2019-06-12 MED ORDER — KETOROLAC TROMETHAMINE 15 MG/ML IJ SOLN
15.0000 mg | Freq: Once | INTRAMUSCULAR | Status: AC
  Administered 2019-06-12: 15 mg via INTRAVENOUS
  Filled 2019-06-12: qty 1

## 2019-06-12 MED ORDER — METHYLPREDNISOLONE 4 MG PO TBPK: ORAL_TABLET | ORAL | 0 refills | Status: DC

## 2019-06-12 MED ORDER — SODIUM CHLORIDE 0.9 % IV SOLN: INTRAVENOUS | Status: DC

## 2019-06-12 MED ORDER — DIPHENHYDRAMINE HCL 50 MG/ML IJ SOLN
12.5000 mg | Freq: Once | INTRAMUSCULAR | Status: AC
  Administered 2019-06-12: 12.5 mg via INTRAVENOUS
  Filled 2019-06-12: qty 1

## 2019-06-12 MED ORDER — DEXAMETHASONE SODIUM PHOSPHATE 10 MG/ML IJ SOLN
10.0000 mg | Freq: Once | INTRAMUSCULAR | Status: AC
  Administered 2019-06-12: 10 mg via INTRAVENOUS
  Filled 2019-06-12: qty 1

## 2019-06-12 MED ORDER — SODIUM CHLORIDE 0.9 % IV BOLUS
1000.0000 mL | Freq: Once | INTRAVENOUS | Status: AC
  Administered 2019-06-12: 1000 mL via INTRAVENOUS

## 2019-06-12 MED ORDER — NAPROXEN 375 MG PO TABS
375.0000 mg | ORAL_TABLET | Freq: Two times a day (BID) | ORAL | 0 refills | Status: DC
Start: 2019-06-12 — End: 2019-06-21

## 2019-06-12 NOTE — ED Provider Notes (Signed)
[facility]-EMERGENCY DEPT Provider Note   CSN: [id number] Arrival date & time: 06/12/19  0844     History Chief Complaint  Patient presents with  . Headache  . Back Pain  . Cough    Jiovani Mccammon is a 59 y.o. male who presents emergency department with chief complaint of headache.  Patient has a history of migraine headaches.  He reports that he has had onset of body aches, back pain, fatigue, malaise and progressively worsening headache since last Tuesday.  He reports the headache as in the distribution of a hair band from ear to ear, squeezing, constant.  Overnight the patient's headache became severe.  He states that this is nothing like his migraine headaches.  He denies aura, nausea, vomiting, photophobia.  Since last week he has had a progressively worsening cough which he attributes to asthma, he denies chest pain or shortness of breath.  He has been taking Tylenol for relief of his symptoms.  He has had temperatures ranging between 99.5 and 100.  He was tested Wednesday of last week for Covid and was negative.  The patient was given the [employer] vaccine on May 18, 2019.  HPI     Past Medical History:  Diagnosis Date  . Arthritis   . Ascending aorta dilation (HCC)   . Hyperlipemia   . Hypertension   . Low testosterone   . Migraine   . Premature atrial contractions   . PSVT (paroxysmal supraventricular tachycardia) (HCC)    a. syncope/HR 200s -> SVT/AVNRT s/p ablation 2015.  Marland Kitchen PVC's (premature ventricular contractions)   . Syncope   . Tachycardia    6 [place]    Patient Active Problem List   Diagnosis Date Noted  . Mixed hyperlipidemia 02/17/2018  . Pulmonary nodule seen on imaging study 02/17/2018  . Ascending aorta dilatation (HCC) 01/11/2018  . Cough variant asthma 06/18/2017  . SVT (supraventricular tachycardia) (HCC) 10/07/2016  . Low testosterone 09/19/2016  . Arthralgia 09/17/2016  . PAC (premature atrial  contraction) 09/13/2015  . PVC (premature ventricular contraction) 09/13/2015  . Migraines 05/14/2015  . Osteoarthritis 05/14/2015  . History of cardiac radiofrequency ablation 05/14/2015  . Essential hypertension, benign 05/14/2015    Past Surgical History:  Procedure Laterality Date  . APPENDECTOMY    . CHOLECYSTECTOMY    . psvt typical    . TONSILLECTOMY    . typical AVNRT         Family History  Problem Relation Age of Onset  . Hyperlipidemia Mother   . Heart attack Mother   . Multiple sclerosis Mother   . Arrhythmia Mother   . Allergies Mother   . Heart disease Father   . Allergies Father   . Multiple sclerosis Brother   . Hyperlipidemia Maternal Grandmother     Social History   Tobacco Use  . Smoking status: Never Smoker  . Smokeless tobacco: Never Used  Substance Use Topics  . Alcohol use: Yes  . Drug use: No    Home Medications Prior to Admission medications   Medication Sig Start Date End Date Taking? Authorizing Provider  albuterol (VENTOLIN HFA) 108 (90 Base) MCG/ACT inhaler Inhale 2 puffs into the lungs every 4 (four) hours as needed for wheezing or shortness of breath. 06/12/19   Midas Daughety, Cammy Copa, PA-C  amLODipine (NORVASC) 5 MG tablet TAKE 1 TABLET(5 MG) BY MOUTH DAILY 02/15/19   Corky Crafts, MD  methylPREDNISolone (MEDROL DOSEPAK) 4 MG TBPK  tablet Use as directed 06/12/19   Margarita Mail, PA-C  metoprolol succinate (TOPROL-XL) 25 MG 24 hr tablet TAKE 1 TABLET(25 MG) BY MOUTH DAILY 02/15/19   Jettie Booze, MD  naproxen (NAPROSYN) 375 MG tablet Take 1 tablet (375 mg total) by mouth 2 (two) times daily. 06/12/19   Margarita Mail, PA-C  SUMAtriptan (IMITREX) 25 MG tablet Take 1 tablet (25 mg total) by mouth every 2 (two) hours as needed for migraine. May repeat in 2 hours if headache persists or recurs. 02/17/18   Binnie Rail, MD    Allergies    Penicillins, Quinolones, Statins, Yellow jacket venom, and Sulfa antibiotics  Review of Systems    Review of Systems Ten systems reviewed and are negative for acute change, except as noted in the HPI.   Physical Exam Updated Vital Signs BP 123/82   Pulse 66   Temp 99.4 F (37.4 C) (Oral)   Resp 16   Ht 6\' 2"  (1.88 m)   Wt 102.1 kg   SpO2 96%   BMI 28.89 kg/m   Physical Exam  Physical Exam  Constitutional: Pt is oriented to person, place, and time. Pt appears well-developed and well-nourished. No distress.  HENT:  Head: Normocephalic and atraumatic.  Mouth/Throat: Oropharynx is clear and moist.  Geographic tongue Eyes: Conjunctivae and EOM are normal. Pupils are equal, round, and reactive to light. No scleral icterus.  No horizontal, vertical or rotational nystagmus  Neck: Normal range of motion. Neck supple.  Full active and passive ROM without pain No midline or paraspinal tenderness No nuchal rigidity or meningeal signs  Cardiovascular: Normal rate, regular rhythm and intact distal pulses.   Pulmonary/Chest: Effort normal and breath sounds normal. No respiratory distress. Pt has no wheezes. No rales. intermittent bouts of coughing consistent with bronchospasm Abdominal: Soft. Bowel sounds are normal. There is no tenderness. There is no rebound and no guarding.  Musculoskeletal: Normal range of motion.  Lymphadenopathy:    No cervical adenopathy.  Neurological: Pt. is alert and oriented to person, place, and time. He has normal reflexes. No cranial nerve deficit.  Exhibits normal muscle tone. Coordination normal.  Mental Status:  Alert, oriented, thought content appropriate. Speech fluent without evidence of aphasia. Able to follow 2 step commands without difficulty.  Cranial Nerves:  II:  Peripheral visual fields grossly normal, pupils equal, round, reactive to light III,IV, VI: ptosis not present, extra-ocular motions intact bilaterally  V,VII: smile symmetric, facial light touch sensation equal VIII: hearing grossly normal bilaterally  IX,X: midline uvula rise    XI: bilateral shoulder shrug equal and strong XII: midline tongue extension  Motor:  5/5 in upper and lower extremities bilaterally including strong and equal grip strength and dorsiflexion/plantar flexion Sensory: Pinprick and light touch normal in all extremities.  Deep Tendon Reflexes: 2+ and symmetric  Cerebellar: normal finger-to-nose with bilateral upper extremities Gait: normal gait and balance CV: distal pulses palpable throughout   Skin: Skin is warm and dry. No rash noted. Pt is not diaphoretic.  Psychiatric: Pt has a normal mood and affect. Behavior is normal. Judgment and thought content normal.  Nursing note and vitals reviewed.   ED Results / Procedures / Treatments   Labs (all labs ordered are listed, but only abnormal results are displayed) Labs Reviewed  CBC WITH DIFFERENTIAL/PLATELET - Abnormal; Notable for the following components:      Result Value   Platelets 128 (*)    All other components within normal limits  BASIC METABOLIC  PANEL - Abnormal; Notable for the following components:   Glucose, Bld 105 (*)    Calcium 8.7 (*)    All other components within normal limits  URINALYSIS, ROUTINE W REFLEX MICROSCOPIC - Abnormal; Notable for the following components:   Color, Urine AMBER (*)    All other components within normal limits  RESPIRATORY PANEL BY RT PCR (FLU A&B, COVID)    EKG None  Radiology CT HEAD WO CONTRAST  Result Date: 06/12/2019 CLINICAL DATA:  Headache in bilateral temples and fever since Tuesday. EXAM: CT HEAD WITHOUT CONTRAST TECHNIQUE: Contiguous axial images were obtained from the base of the skull through the vertex without intravenous contrast. COMPARISON:  None. FINDINGS: Brain: No evidence of acute infarction, hemorrhage, hydrocephalus, extra-axial collection or mass lesion/mass effect. Vascular: No hyperdense vessel or unexpected calcification. Skull: Normal. Negative for fracture or focal lesion. Sinuses/Orbits: Mucous retention cyst in  the left maxillary and ethmoid sinuses. The orbits are unremarkable. A solid present lately radius every 50 of a separate had elected Other: There is nonspecific mild soft tissue density in the posterolateral left scalp (series 3, image 18). IMPRESSION: 1. No acute intracranial abnormality. 2. Nonspecific mild soft tissue density in the posterolateral left scalp. Correlate with physical exam. Electronically Signed   By: Emmaline Kluver M.D.   On: 06/12/2019 12:34   DG Chest Port 1 View  Result Date: 06/12/2019 CLINICAL DATA:  Dyspnea EXAM: PORTABLE CHEST 1 VIEW COMPARISON:  04/30/2017 chest radiograph. FINDINGS: Stable cardiomediastinal silhouette with normal heart size. No pneumothorax. No pleural effusion. Apical right upper lobe tiny calcified granuloma is stable. No pulmonary edema. No acute consolidative airspace disease. IMPRESSION: No active disease. Electronically Signed   By: Delbert Phenix M.D.   On: 06/12/2019 12:05    Procedures Procedures (including critical care time)  Medications Ordered in ED Medications  sodium chloride 0.9 % bolus 1,000 mL (0 mLs Intravenous Stopped 06/12/19 1337)  ketorolac (TORADOL) 15 MG/ML injection 15 mg (15 mg Intravenous Given 06/12/19 1204)  prochlorperazine (COMPAZINE) injection 10 mg (10 mg Intravenous Given 06/12/19 1204)  diphenhydrAMINE (BENADRYL) injection 12.5 mg (12.5 mg Intravenous Given 06/12/19 1204)  dexamethasone (DECADRON) injection 10 mg (10 mg Intravenous Given 06/12/19 1326)    ED Course  I have reviewed the triage vital signs and the nursing notes.  Pertinent labs & imaging results that were available during my care of the patient were reviewed by me and considered in my medical decision making (see chart for details).  Clinical Course as of Jun 12 840  Sun Jun 12, 2019  1324 Platelets(!): 128 [AH]    Clinical Course User Index [AH] Arthor Captain, PA-C   MDM Rules/Calculators/A&P                      BH:ALPFXTKW/IOX like sxs VS:  BP 123/82   Pulse 66   Temp 99.4 F (37.4 C) (Oral)   Resp 16   Ht 6\' 2"  (1.88 m)   Wt 102.1 kg   SpO2 96%   BMI 28.89 kg/m  is gathered by patient  and emr. Previous records obtained and reviewed. DDX:The patient's complaint of Headache involves an extensive number of diagnostic and treatment options, and is a complaint that carries with it a high risk of complications, morbidity, and potential mortality. Given the large differential diagnosis, medical decision making is of high complexity. Emergent considerations for headache include subarachnoid hemorrhage, meningitis, temporal arteritis, glaucoma, cerebral ischemia, carotid/vertebral dissection, intracranial tumor, Venous  sinus thrombosis, carbon monoxide poisoning, acute or chronic subdural hemorrhage.  Other considerations include: Migraine, Cluster headache, Hypertension, Caffeine, alcohol, or drug withdrawal, Pseudotumor cerebri, Arteriovenous malformation, Head injury, Neurocysticercosis, Post-lumbar puncture, Preeclampsia, Tension headache, Sinusitis, Cervical arthritis, Refractive error causing strain, Dental abscess, Otitis media, Temporomandibular joint syndrome, Depression, Somatoform disorder (e.g., somatization) Trigeminal neuralgia, Glossopharyngeal neuralgia. Labs: I ordered reviewed and interpreted labs which include Cbc w/ mild thrombocytopenia, UA, covid and flu negative, BMP w/o sig abnormality Imaging: I ordered and reviewed images which included CT head and port 1 v cxr. I independently visualized and interpreted all imaging. There are no acute, significant findings on either of today's images. EKG:n/a Consults: MDM: Patient with Headache and flu like sxs. He has been treated in the ed for migraine h/a with Toradol, decadron, benadryl and compazine with complete resolution of both headache and his bronchospasm. I have very low suspicion for dural venous thrombosis after J&J vaccine and in shared decision making have  decided to forgo MRV at this time. He may return for worsening or persistent H/A. I suspect some sort of viral illness although allergic and autoimmune sxs may be underlying. Will d/c with albuterol, medrol Dosepak and NSAID. Close f/u and return precautions discussed and given in written AVS instructions. Back pain is resolved and I do not believe this is related to his known thoracic aneurysm. Patient seen in shared visit with attending physician. Who agrees with assessment, work up , treatment, and plan for discharge.  The patient appears reasonably screened and/or stabilized for discharge and I doubt any other medical condition or other [facility] requiring further screening, evaluation, or treatment in the ED at this time prior to discharge. I have discussed lab and/or imaging findings with the patient and answered all questions/concerns to the best of my ability.I have discussed return precautions and OP follow up.  Cameran Pettey was evaluated in Emergency Department on 06/13/2019 for the symptoms described in the history of present illness. He was evaluated in the context of the global COVID-19 pandemic, which necessitated consideration that the patient might be at risk for infection with the SARS-CoV-2 virus that causes COVID-19. Institutional protocols and algorithms that pertain to the evaluation of patients at risk for COVID-19 are in a state of rapid change based on information released by regulatory bodies including the CDC and federal and state organizations. These policies and algorithms were followed during the patient's care in the ED.  Final Clinical Impression(s) / ED Diagnoses Final diagnoses:  Viral illness  Bronchospasm  Bad headache    Rx / DC Orders ED Discharge Orders         Ordered    methylPREDNISolone (MEDROL DOSEPAK) 4 MG TBPK tablet     06/12/19 1402    albuterol (VENTOLIN HFA) 108 (90 Base) MCG/ACT inhaler  Every 4 hours PRN     06/12/19 1402    naproxen (NAPROSYN) 375  MG tablet  2 times daily     06/12/19 1402           Arthor Captain, PA-C 06/13/19 0842    Melene Plan, DO 06/13/19 1502

## 2019-06-12 NOTE — Discharge Instructions (Addendum)

## 2019-06-12 NOTE — ED Notes (Signed)
Patient transported to CT 

## 2019-06-12 NOTE — ED Notes (Signed)
XR at bedside

## 2019-06-12 NOTE — ED Triage Notes (Signed)
Pt reports that he had headache and cough since Tuesday with back and neck pain. Reports headache has gotten worse and not sleeping. Took Tylenol at 3am. Had [employer] vaccine and wife is concerned about his severe headache, back and neck pains. Reports had negative Covid test on Wed.

## 2019-06-15 ENCOUNTER — Encounter: Payer: Self-pay | Admitting: Internal Medicine

## 2019-06-20 NOTE — Progress Notes (Signed)
Subjective:    Patient ID: Daniel Petty, male    DOB: 12/24/1960, 59 y.o.   MRN: [id number]  HPI The patient is here for follow up of their chronic medical problems, including hypertension, migraines, hyperlipidemia  He went to the ED earlier this month for a URI.  He had a low grade fever, cough, headache that was not a migraine.  He was prescribed a medrol dose pak.  He was also prescribed naproxen and albuterol.  He has been using albuterol Q 4 hrs.  It is helping his allergies/cough.  He is always had allergies associated with cough from sometimes the end of January all the way through June.  He believes it is related to the tree pollen.  Nothing has really helped in the past-he has tried antihistamines and he saw pulmonary at one point and tried an inhaler.  The albuterol does seem to be helping.  Osteoarthritis: Since taking the naproxen he was prescribed it has helped his osteoarthritis more than the ibuprofen has.  He often takes [id number] ibuprofen when he takes it and sometimes a couple of times a day.  He was wondering if the naproxen was something he could continue.  Medications and allergies reviewed with patient and updated if appropriate.  Patient Active Problem List   Diagnosis Date Noted  . Mixed hyperlipidemia 02/17/2018  . Pulmonary nodule seen on imaging study 02/17/2018  . Ascending aorta dilatation (HCC) 01/11/2018  . Cough variant asthma 06/18/2017  . SVT (supraventricular tachycardia) (HCC) 10/07/2016  . Low testosterone 09/19/2016  . Arthralgia 09/17/2016  . PAC (premature atrial contraction) 09/13/2015  . PVC (premature ventricular contraction) 09/13/2015  . Migraines 05/14/2015  . Osteoarthritis 05/14/2015  . History of cardiac radiofrequency ablation 05/14/2015  . Essential hypertension, benign 05/14/2015    Current Outpatient Medications on File Prior to Visit  Medication Sig Dispense Refill  . albuterol (VENTOLIN HFA) 108 (90 Base) MCG/ACT inhaler  Inhale 2 puffs into the lungs every 4 (four) hours as needed for wheezing or shortness of breath. 8 g 0  . amLODipine (NORVASC) 5 MG tablet TAKE 1 TABLET(5 MG) BY MOUTH DAILY 90 tablet 3  . metoprolol succinate (TOPROL-XL) 25 MG 24 hr tablet TAKE 1 TABLET(25 MG) BY MOUTH DAILY 90 tablet 3  . SUMAtriptan (IMITREX) 25 MG tablet Take 1 tablet (25 mg total) by mouth every 2 (two) hours as needed for migraine. May repeat in 2 hours if headache persists or recurs. 10 tablet 11   No current facility-administered medications on file prior to visit.    Past Medical History:  Diagnosis Date  . Arthritis   . Ascending aorta dilation (HCC)   . Hyperlipemia   . Hypertension   . Low testosterone   . Migraine   . Premature atrial contractions   . PSVT (paroxysmal supraventricular tachycardia) (HCC)    a. syncope/HR 200s -> SVT/AVNRT s/p ablation 2015.  Marland Kitchen PVC's (premature ventricular contractions)   . Syncope   . Tachycardia    6 years ago while in the [place]    Past Surgical History:  Procedure Laterality Date  . APPENDECTOMY    . CHOLECYSTECTOMY    . psvt typical    . TONSILLECTOMY    . typical AVNRT      Social History   Socioeconomic History  . Marital status: Married    Spouse name: Not on file  . Number of children: Not on file  . Years of education: Not on file  .  Highest education level: Not on file  Occupational History  . Not on file  Tobacco Use  . Smoking status: Never Smoker  . Smokeless tobacco: Never Used  Substance and Sexual Activity  . Alcohol use: Yes  . Drug use: No  . Sexual activity: Not on file  Other Topics Concern  . Not on file  Social History Narrative   Exercise: regular   Social Determinants of Health   Financial Resource Strain:   . Difficulty of Paying Living Expenses:   Food Insecurity:   . Worried About Charity fundraiser in the Last Year:   . Arboriculturist in the Last Year:   Transportation Needs:   . Film/video editor (Medical):    Marland Kitchen Lack of Transportation (Non-Medical):   Physical Activity:   . Days of Exercise per Week:   . Minutes of Exercise per Session:   Stress:   . Feeling of Stress :   Social Connections:   . Frequency of Communication with Friends and Family:   . Frequency of Social Gatherings with Friends and Family:   . Attends Religious Services:   . Active Member of Clubs or Organizations:   . Attends Archivist Meetings:   Marland Kitchen Marital Status:     Family History  Problem Relation Age of Onset  . Hyperlipidemia Mother   . Heart attack Mother   . Multiple sclerosis Mother   . Arrhythmia Mother   . Allergies Mother   . Heart disease Father   . Allergies Father   . Multiple sclerosis Brother   . Hyperlipidemia Maternal Grandmother     Review of Systems  Constitutional: Negative for chills and fever.  Respiratory: Positive for cough and wheezing (mild at end of cough). Negative for shortness of breath.   Cardiovascular: Negative for chest pain, palpitations and leg swelling.  Musculoskeletal: Positive for arthralgias.  Neurological: Positive for light-headedness and headaches (migraines).       Objective:   Vitals:   06/21/19 1330  BP: 120/74  Pulse: 65  Resp: 16  Temp: 97.8 F (36.6 C)  SpO2: 98%   BP Readings from Last 3 Encounters:  06/21/19 120/74  06/12/19 123/82  06/02/19 (!) 160/99   Wt Readings from Last 3 Encounters:  06/21/19 225 lb 12.8 oz (102.4 kg)  06/12/19 225 lb (102.1 kg)  06/02/19 230 lb (104.3 kg)   Body mass index is 28.99 kg/m.   Physical Exam    Constitutional: Appears well-developed and well-nourished. No distress.  HENT:  Head: Normocephalic and atraumatic.  Neck: Neck supple. No tracheal deviation present. No thyromegaly present.  No cervical lymphadenopathy Cardiovascular: Normal rate, regular rhythm and normal heart sounds.   No murmur heard. No carotid bruit .  No edema Pulmonary/Chest: Effort normal and breath sounds normal. No  respiratory distress. No has no wheezes. No rales. Skin: Skin is warm and dry. Not diaphoretic.  Psychiatric: Normal mood and affect. Behavior is normal.      Assessment & Plan:    See Problem List for Assessment and Plan of chronic medical problems.    This visit occurred during the SARS-CoV-2 public health emergency.  Safety protocols were in place, including screening questions prior to the visit, additional usage of staff PPE, and extensive cleaning of exam room while observing appropriate contact time as indicated for disinfecting solutions.

## 2019-06-20 NOTE — Patient Instructions (Addendum)
  Blood work was ordered.     Medications reviewed and updated.  Changes include :     zetia for cholesterol  asmanex for cough  nurtec for migraines ubrelvy for migraines  Your prescription(s) have been submitted to your pharmacy. Please take as directed and contact our office if you believe you are having problem(s) with the medication(s).     Please followup in 1 year

## 2019-06-21 ENCOUNTER — Other Ambulatory Visit: Payer: Self-pay

## 2019-06-21 ENCOUNTER — Ambulatory Visit (INDEPENDENT_AMBULATORY_CARE_PROVIDER_SITE_OTHER): Payer: Managed Care, Other (non HMO) | Admitting: Internal Medicine

## 2019-06-21 ENCOUNTER — Encounter: Payer: Self-pay | Admitting: Internal Medicine

## 2019-06-21 VITALS — BP 120/74 | HR 65 | Temp 97.8°F | Resp 16 | Ht 74.0 in | Wt 225.8 lb

## 2019-06-21 DIAGNOSIS — R739 Hyperglycemia, unspecified: Secondary | ICD-10-CM

## 2019-06-21 DIAGNOSIS — Z125 Encounter for screening for malignant neoplasm of prostate: Secondary | ICD-10-CM

## 2019-06-21 DIAGNOSIS — M8949 Other hypertrophic osteoarthropathy, multiple sites: Secondary | ICD-10-CM

## 2019-06-21 DIAGNOSIS — E782 Mixed hyperlipidemia: Secondary | ICD-10-CM | POA: Diagnosis not present

## 2019-06-21 DIAGNOSIS — M159 Polyosteoarthritis, unspecified: Secondary | ICD-10-CM

## 2019-06-21 DIAGNOSIS — M150 Primary generalized (osteo)arthritis: Secondary | ICD-10-CM

## 2019-06-21 DIAGNOSIS — J45991 Cough variant asthma: Secondary | ICD-10-CM

## 2019-06-21 DIAGNOSIS — G43109 Migraine with aura, not intractable, without status migrainosus: Secondary | ICD-10-CM

## 2019-06-21 DIAGNOSIS — I10 Essential (primary) hypertension: Secondary | ICD-10-CM | POA: Diagnosis not present

## 2019-06-21 MED ORDER — EPINEPHRINE 0.3 MG/0.3ML IJ SOAJ: 0.3000 mg | Freq: Once | INTRAMUSCULAR | 0 refills | Status: AC

## 2019-06-21 MED ORDER — UBRELVY 50 MG PO TABS: ORAL_TABLET | ORAL | 1 refills | Status: AC

## 2019-06-21 MED ORDER — NURTEC 75 MG PO TBDP: 75.0000 mg | ORAL_TABLET | Freq: Once | ORAL | 0 refills | Status: DC | PRN

## 2019-06-21 MED ORDER — ASMANEX (60 METERED DOSES) 220 MCG/INH IN AEPB
2.0000 | INHALATION_SPRAY | Freq: Every day | RESPIRATORY_TRACT | 12 refills | Status: DC
Start: 2019-06-21 — End: 2020-02-13

## 2019-06-21 MED ORDER — NAPROXEN 375 MG PO TABS: 375.0000 mg | ORAL_TABLET | Freq: Two times a day (BID) | ORAL | 1 refills | Status: DC

## 2019-06-21 MED ORDER — EZETIMIBE 10 MG PO TABS
10.0000 mg | ORAL_TABLET | Freq: Every day | ORAL | 3 refills | Status: DC
Start: 2019-06-21 — End: 2020-06-06

## 2019-06-21 NOTE — Assessment & Plan Note (Addendum)
Chronic About once a month Takes imitrex as needed - twice in the last year Usually takes excedrin migraine-if he takes this right away if this will control the migraine and he can avoid the Imitrex, which causes him to be a little drowsy Samples of Nurtec and Ubrelvy given for him to try to see if he tolerates them better

## 2019-06-21 NOTE — Assessment & Plan Note (Signed)
Mild hyperglycemia Check A1c

## 2019-06-21 NOTE — Assessment & Plan Note (Signed)
Chronic BP well controlled Current regimen effective and well tolerated Continue current medications at current doses cmp  

## 2019-06-21 NOTE — Assessment & Plan Note (Signed)
Chronic Has evidence of aortic atherosclerosis in the ascending aortic dilation Following with CTS and cardiology Has not tolerated statins Agrees to try Zetia Check CMP, lipid panel in about 6 weeks

## 2019-06-21 NOTE — Assessment & Plan Note (Addendum)
Has cough variant asthma in spring - January to June Related to seasonal allergies Oral antihistamines and Dulera have not been effective in the past He recently took a Medrol Dosepak and is using the albuterol every 4 hours and it is helping Likely the combination of both that seem to be helping Discussed that ideally he should take the albuterol that often-we will try Asmanex to see if that helps, but if not he will continue with just the albuterol

## 2019-06-21 NOTE — Assessment & Plan Note (Signed)
Chronic Has arthritis Taking ibuprofen, which does help, but taking high doses Naprosyn has been more helpful and we will continue that at current dose Discussed long-term side effects of medication including GI bleeds, not ideally good for the heart and potentially leading to kidney disease This is likely better than the high dose ibuprofen he has been taking

## 2019-08-02 ENCOUNTER — Other Ambulatory Visit (INDEPENDENT_AMBULATORY_CARE_PROVIDER_SITE_OTHER): Payer: Managed Care, Other (non HMO)

## 2019-08-02 ENCOUNTER — Other Ambulatory Visit: Payer: Self-pay

## 2019-08-02 DIAGNOSIS — E782 Mixed hyperlipidemia: Secondary | ICD-10-CM | POA: Diagnosis not present

## 2019-08-02 DIAGNOSIS — Z125 Encounter for screening for malignant neoplasm of prostate: Secondary | ICD-10-CM

## 2019-08-02 DIAGNOSIS — R739 Hyperglycemia, unspecified: Secondary | ICD-10-CM

## 2019-08-02 DIAGNOSIS — I10 Essential (primary) hypertension: Secondary | ICD-10-CM | POA: Diagnosis not present

## 2019-08-02 LAB — LIPID PANEL
Cholesterol: 125 mg/dL (ref 0–200)
HDL: 30.9 mg/dL — ABNORMAL LOW (ref >39.00)
LDL Cholesterol: 70 mg/dL (ref 0–99)
NonHDL: 93.95
Total CHOL/HDL Ratio: 4
Triglycerides: 122 mg/dL (ref 0.0–149.0)
VLDL: 24.4 mg/dL (ref 0.0–40.0)

## 2019-08-02 LAB — COMPREHENSIVE METABOLIC PANEL
ALT: 14 U/L (ref 0–53)
AST: 15 U/L (ref 0–37)
Albumin: 4.2 g/dL (ref 3.5–5.2)
Alkaline Phosphatase: 100 U/L (ref 39–117)
BUN: 15 mg/dL (ref 6–23)
CO2: 29 mEq/L (ref 19–32)
Calcium: 8.9 mg/dL (ref 8.4–10.5)
Chloride: 106 mEq/L (ref 96–112)
Creatinine, Ser: 0.76 mg/dL (ref 0.40–1.50)
GFR: 104.25 mL/min (ref >60.00)
Glucose, Bld: 100 mg/dL — ABNORMAL HIGH (ref 70–99)
Potassium: 4.2 mEq/L (ref 3.5–5.1)
Sodium: 140 mEq/L (ref 135–145)
Total Bilirubin: 1 mg/dL (ref 0.2–1.2)
Total Protein: 6.3 g/dL (ref 6.0–8.3)

## 2019-08-02 LAB — HEMOGLOBIN A1C: Hgb A1c MFr Bld: 5.2 % (ref 4.6–6.5)

## 2019-08-03 LAB — PSA, TOTAL AND FREE
PSA, % Free: 13 % (calc) — ABNORMAL LOW (ref >25)
PSA, Free: 0.1 ng/mL
PSA, Total: 0.8 ng/mL (ref <=4.0)

## 2019-09-02 ENCOUNTER — Encounter (HOSPITAL_COMMUNITY): Payer: Self-pay

## 2019-09-02 ENCOUNTER — Ambulatory Visit (HOSPITAL_COMMUNITY)
Admission: EM | Admit: 2019-09-02 | Discharge: 2019-09-02 | Disposition: A | Discharge status: Home / Self Care | Payer: Managed Care, Other (non HMO) | Attending: Family Medicine | Admitting: Family Medicine

## 2019-09-02 ENCOUNTER — Other Ambulatory Visit: Payer: Self-pay

## 2019-09-02 DIAGNOSIS — L03116 Cellulitis of left lower limb: Secondary | ICD-10-CM

## 2019-09-02 MED ORDER — DOXYCYCLINE HYCLATE 100 MG PO CAPS
100.0000 mg | ORAL_CAPSULE | Freq: Two times a day (BID) | ORAL | 0 refills | Status: AC
Start: 2019-09-02 — End: 2019-09-09

## 2019-09-02 NOTE — ED Triage Notes (Signed)
Pt presents with old wound on left leg with no pain that appears to be inflammed and draining fluid X 2 months.

## 2019-09-02 NOTE — Discharge Instructions (Addendum)
Take the antibiotic as prescribed for infection Follow-up for any continued or worsening problems.
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
## 2019-10-06 ENCOUNTER — Encounter (HOSPITAL_COMMUNITY): Payer: Self-pay | Admitting: Emergency Medicine

## 2019-10-06 ENCOUNTER — Other Ambulatory Visit: Payer: Self-pay

## 2019-10-06 ENCOUNTER — Encounter: Payer: Self-pay | Admitting: Internal Medicine

## 2019-10-06 ENCOUNTER — Ambulatory Visit (HOSPITAL_COMMUNITY)
Admission: EM | Admit: 2019-10-06 | Discharge: 2019-10-06 | Disposition: A | Discharge status: Home / Self Care | Payer: Managed Care, Other (non HMO) | Attending: Family Medicine | Admitting: Family Medicine

## 2019-10-06 DIAGNOSIS — L03116 Cellulitis of left lower limb: Secondary | ICD-10-CM | POA: Diagnosis not present

## 2019-10-06 MED ORDER — DOXYCYCLINE HYCLATE 100 MG PO CAPS
100.0000 mg | ORAL_CAPSULE | Freq: Two times a day (BID) | ORAL | 0 refills | Status: DC
Start: 2019-10-06 — End: 2019-10-13

## 2019-10-06 NOTE — Discharge Instructions (Addendum)
Take the doxycycline as prescribed.  Follow-up with your primary care for further management of this.

## 2019-10-06 NOTE — ED Triage Notes (Signed)
Pt c/o left leg swelling and draining blood and yellowish clear fluid in the past week.

## 2019-10-07 NOTE — ED Provider Notes (Signed)
[facility]    CSN: [id number] Arrival date & time: 10/06/19  0805      History   Chief Complaint Chief Complaint  Patient presents with  . Leg Swelling    HPI Daniel Petty is a 59 y.o. male.   Patient is a 59 year old male who presents today with left leg swelling, erythema, drainage.  Symptoms have been constant for the past week.  History of similar in the past with cellulitis.  Recently treated for cellulitis.  No fever, chills, body aches or nausea.      Past Medical History:  Diagnosis Date  . Arthritis   . Ascending aorta dilation (HCC)   . Hyperlipemia   . Hypertension   . Low testosterone   . Migraine   . Premature atrial contractions   . PSVT (paroxysmal supraventricular tachycardia) (HCC)    a. syncope/HR 200s -> SVT/AVNRT s/p ablation 2015.  Marland Kitchen PVC's (premature ventricular contractions)   . Syncope   . Tachycardia    6 [place]    Patient Active Problem List   Diagnosis Date Noted  . Hyperglycemia 06/21/2019  . Mixed hyperlipidemia 02/17/2018  . Pulmonary nodule seen on imaging study 02/17/2018  . Ascending aorta dilatation (HCC) 01/11/2018  . Cough variant asthma 06/18/2017  . SVT (supraventricular tachycardia) (HCC) 10/07/2016  . Low testosterone 09/19/2016  . Arthralgia 09/17/2016  . PAC (premature atrial contraction) 09/13/2015  . PVC (premature ventricular contraction) 09/13/2015  . Migraines 05/14/2015  . Osteoarthritis 05/14/2015  . History of cardiac radiofrequency ablation 05/14/2015  . Essential hypertension, benign 05/14/2015    Past Surgical History:  Procedure Laterality Date  . APPENDECTOMY    . CHOLECYSTECTOMY    . psvt typical    . TONSILLECTOMY    . typical AVNRT         Home Medications    Prior to Admission medications   Medication Sig Start Date End Date Taking? Authorizing Provider  albuterol (VENTOLIN HFA) 108 (90 Base) MCG/ACT inhaler Inhale 2 puffs into the lungs every 4  (four) hours as needed for wheezing or shortness of breath. 06/12/19  Yes Harris, Abigail, PA-C  amLODipine (NORVASC) 5 MG tablet TAKE 1 TABLET(5 MG) BY MOUTH DAILY 02/15/19  Yes Corky Crafts, MD  ezetimibe (ZETIA) 10 MG tablet Take 1 tablet (10 mg total) by mouth daily. 06/21/19  Yes Burns, Bobette Mo, MD  metoprolol succinate (TOPROL-XL) 25 MG 24 hr tablet TAKE 1 TABLET(25 MG) BY MOUTH DAILY 02/15/19  Yes Corky Crafts, MD  mometasone (ASMANEX, 60 METERED DOSES,) 220 MCG/INH inhaler Inhale 2 puffs into the lungs daily. 06/21/19  Yes Burns, Bobette Mo, MD  naproxen (NAPROSYN) 375 MG tablet Take 1 tablet (375 mg total) by mouth 2 (two) times daily with a meal. 06/21/19  Yes Burns, Bobette Mo, MD  Rimegepant Sulfate (NURTEC) 75 MG TBDP Take 75 mg by mouth once as needed for up to 1 dose (migraine). 06/21/19  Yes Burns, Bobette Mo, MD  SUMAtriptan (IMITREX) 25 MG tablet Take 1 tablet (25 mg total) by mouth every 2 (two) hours as needed for migraine. May repeat in 2 hours if headache persists or recurs. 02/17/18  Yes Burns, Bobette Mo, MD  Ubrogepant (UBRELVY) 50 MG TABS Take 50 mg once, may repeat after 1 hour x 1 prn 06/21/19  Yes Burns, Bobette Mo, MD  doxycycline (VIBRAMYCIN) 100 MG capsule Take 1 capsule (100 mg total) by mouth 2 (two) times daily. 10/06/19  Janace Aris, NP    Family History Family History  Problem Relation Age of Onset  . Hyperlipidemia Mother   . Heart attack Mother   . Multiple sclerosis Mother   . Arrhythmia Mother   . Allergies Mother   . Heart disease Father   . Allergies Father   . Multiple sclerosis Brother   . Hyperlipidemia Maternal Grandmother     Social History Social History   Tobacco Use  . Smoking status: Never Smoker  . Smokeless tobacco: Never Used  Vaping Use  . Vaping Use: Never used  Substance Use Topics  . Alcohol use: Yes  . Drug use: No     Allergies   Penicillins, Quinolones, Statins, Yellow jacket venom, and Sulfa antibiotics   Review of  Systems Review of Systems   Physical Exam Triage Vital Signs ED Triage Vitals  Enc Vitals Group     BP 10/06/19 0847 135/87     Pulse Rate 10/06/19 0847 67     Resp 10/06/19 0847 16     Temp 10/06/19 0847 97.9 F (36.6 C)     Temp Source 10/06/19 0847 Oral     SpO2 10/06/19 0847 99 %     Weight --      Height --      Head Circumference --      Peak Flow --      Pain Score 10/06/19 0845 0     Pain Loc --      Pain Edu? --      Excl. in GC? --    No data found.  Updated Vital Signs BP 135/87 (BP Location: Left Arm)   Pulse 67   Temp 97.9 F (36.6 C) (Oral)   Resp 16   SpO2 99%   Visual Acuity Right Eye Distance:   Left Eye Distance:   Bilateral Distance:    Right Eye Near:   Left Eye Near:    Bilateral Near:     Physical Exam Skin:          UC Treatments / Results  Labs (all labs ordered are listed, but only abnormal results are displayed) Labs Reviewed - No data to display  EKG   Radiology No results found.  Procedures Procedures (including critical care time)  Medications Ordered in UC Medications - No data to display  Initial Impression / Assessment and Plan / UC Course  I have reviewed the triage vital signs and the nursing notes.  Pertinent labs & imaging results that were available during my care of the patient were reviewed by me and considered in my medical decision making (see chart for details).     Cellulitis of left lower extremity.  Will represcribe doxycycline This may be chronic issues at may need to be seen by specialist for further management.  Final Clinical Impressions(s) / UC Diagnoses   Final diagnoses:  Cellulitis of left lower extremity     Discharge Instructions     Take the doxycycline as prescribed.  Follow-up with your primary care for further management of this.    ED Prescriptions    Medication Sig Dispense Auth. Provider   doxycycline (VIBRAMYCIN) 100 MG capsule Take 1 capsule (100 mg total) by  mouth 2 (two) times daily. 20 capsule Dahlia Byes A, NP     PDMP not reviewed this encounter.   Dahlia Byes A, NP 10/07/19 1106
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
## 2019-10-14 ENCOUNTER — Ambulatory Visit (INDEPENDENT_AMBULATORY_CARE_PROVIDER_SITE_OTHER): Payer: Managed Care, Other (non HMO)

## 2019-10-14 ENCOUNTER — Ambulatory Visit (INDEPENDENT_AMBULATORY_CARE_PROVIDER_SITE_OTHER): Payer: Managed Care, Other (non HMO) | Admitting: Internal Medicine

## 2019-10-14 ENCOUNTER — Other Ambulatory Visit: Payer: Self-pay

## 2019-10-14 ENCOUNTER — Encounter: Payer: Self-pay | Admitting: Internal Medicine

## 2019-10-14 VITALS — BP 128/82 | HR 67 | Temp 98.0°F | Ht 74.0 in | Wt 233.0 lb

## 2019-10-14 DIAGNOSIS — L03119 Cellulitis of unspecified part of limb: Secondary | ICD-10-CM

## 2019-10-14 DIAGNOSIS — L03114 Cellulitis of left upper limb: Secondary | ICD-10-CM

## 2019-10-14 NOTE — Patient Instructions (Signed)
  Blood work was ordered.  An xray was ordered.   Medications reviewed and updated.  Changes include :   none
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
## 2019-10-15 LAB — CBC WITH DIFFERENTIAL/PLATELET
Absolute Monocytes: 710 cells/uL (ref 200–950)
Basophils Absolute: 73 cells/uL (ref 0–200)
Basophils Relative: 0.8 %
Eosinophils Absolute: 501 cells/uL — ABNORMAL HIGH (ref 15–500)
Eosinophils Relative: 5.5 %
HCT: 44.1 % (ref 38.5–50.0)
Hemoglobin: 14.6 g/dL (ref 13.2–17.1)
Lymphs Abs: 3130 cells/uL (ref 850–3900)
MCH: 30.2 pg (ref 27.0–33.0)
MCHC: 33.1 g/dL (ref 32.0–36.0)
MCV: 91.3 fL (ref 80.0–100.0)
MPV: 10.1 fL (ref 7.5–12.5)
Monocytes Relative: 7.8 %
Neutro Abs: 4687 cells/uL (ref 1500–7800)
Neutrophils Relative %: 51.5 %
Platelets: 206 10*3/uL (ref 140–400)
RBC: 4.83 10*6/uL (ref 4.20–5.80)
RDW: 13 % (ref 11.0–15.0)
Total Lymphocyte: 34.4 %
WBC: 9.1 10*3/uL (ref 3.8–10.8)

## 2019-10-15 LAB — C-REACTIVE PROTEIN: CRP: 0.4 mg/L (ref <8.0)

## 2019-10-15 LAB — SEDIMENTATION RATE: Sed Rate: 2 mm/h (ref 0–20)

## 2020-02-01 ENCOUNTER — Ambulatory Visit: Payer: Managed Care, Other (non HMO) | Admitting: Interventional Cardiology

## 2020-02-10 IMAGING — CT CT ANGIO CHEST
2 of 8 series · 17 of 46 positions shown · IV contrast (iopamidol)
Comparison: Prior chest x-ray 04/30/2017

CLINICAL DATA: 57-year-old male with a history of thoracic aortic
aneurysm

EXAM:
CT ANGIOGRAPHY CHEST WITH CONTRAST
TECHNIQUE: Multidetector CT imaging of the chest was performed using the
standard protocol during bolus administration of intravenous
contrast. Multiplanar CT image reconstructions and MIPs were
obtained to evaluate the vascular anatomy.
CONTRAST:  100mL GN530M-B21 IOPAMIDOL (GN530M-B21) INJECTION 76%

[Series 4: aorta 3.0 i31f 2 · axial · 0.75mm/px · z∈[-359,-68]mm · 14 of 113 slices shown]
[im 8/113  lung]
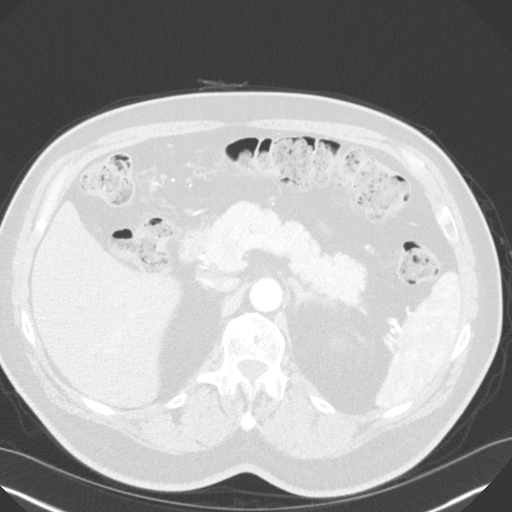
[im 15/113  soft-tissue]
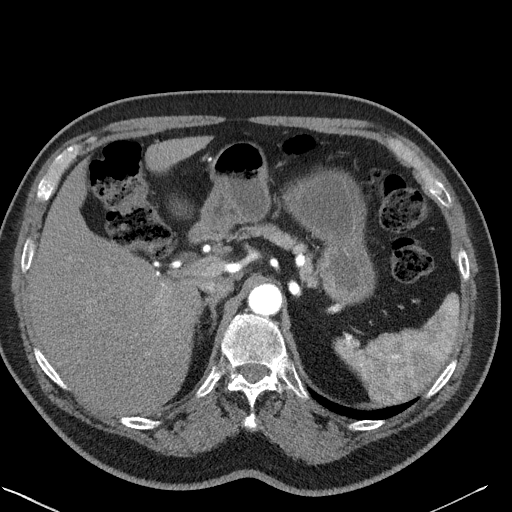
[im 23/113  lung]
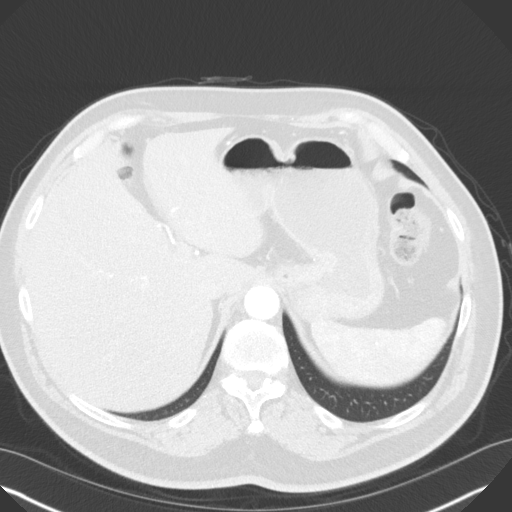
[im 30/113  soft-tissue]
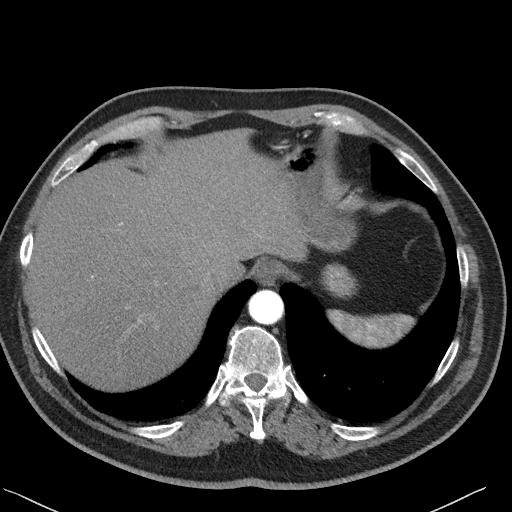
[im 38/113  lung]
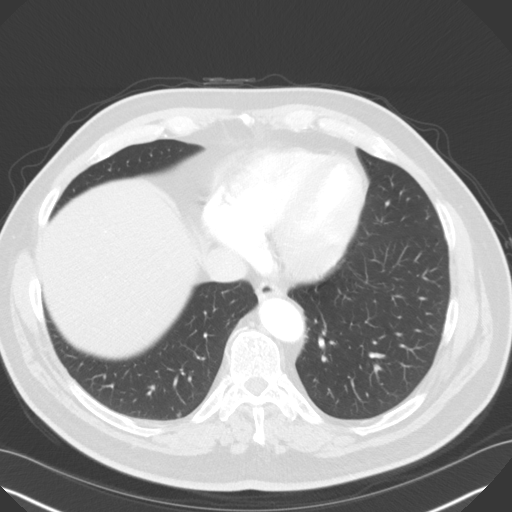
[im 45/113  soft-tissue]
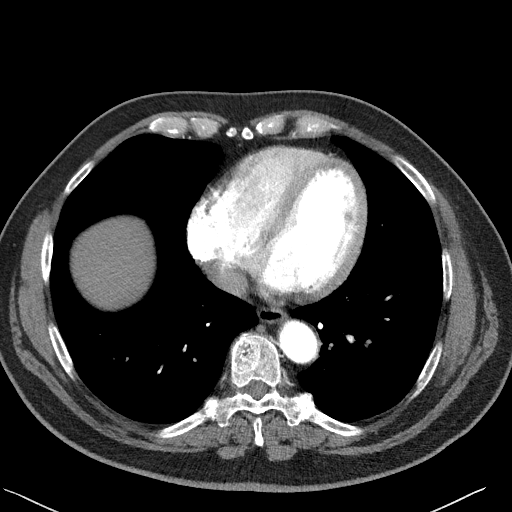
[im 53/113  lung]
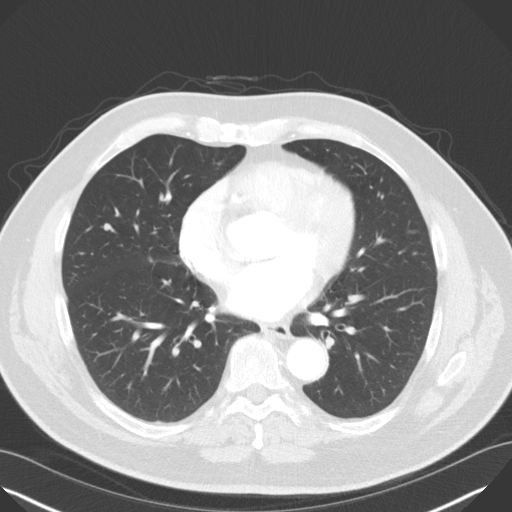
[im 60/113  soft-tissue]
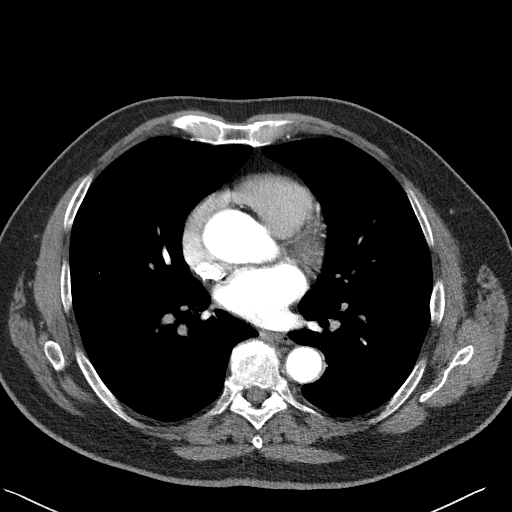
[im 68/113  lung]
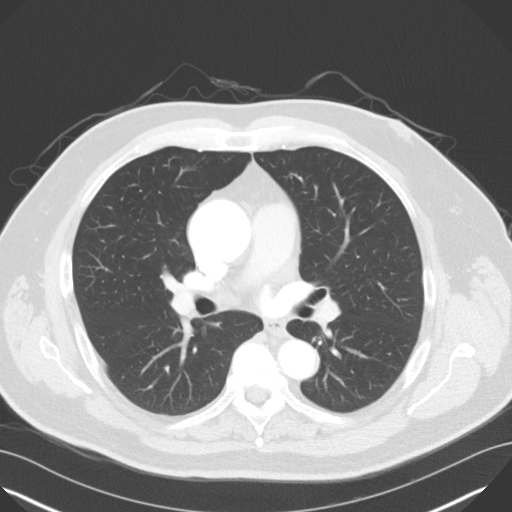
[im 75/113  soft-tissue]
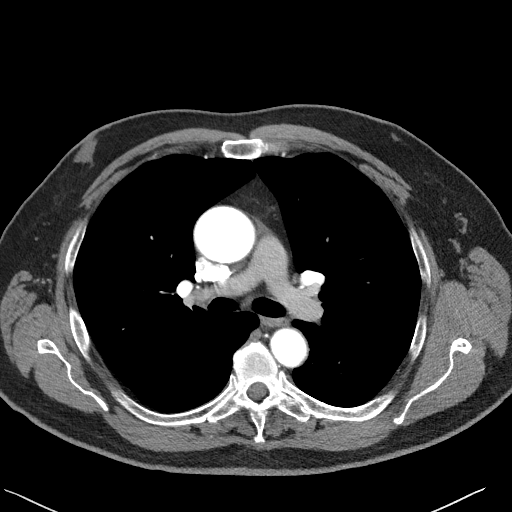
[im 83/113  lung]
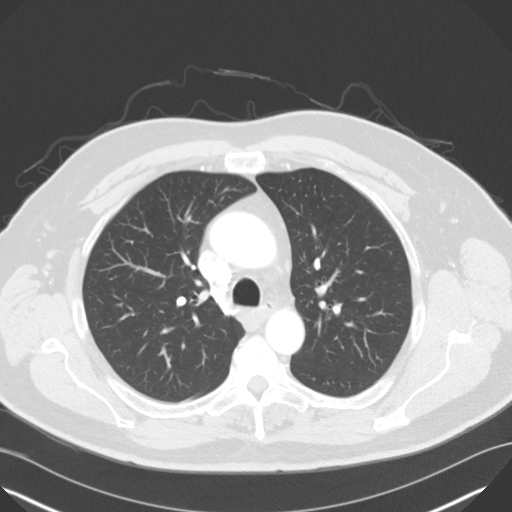
[im 90/113  soft-tissue]
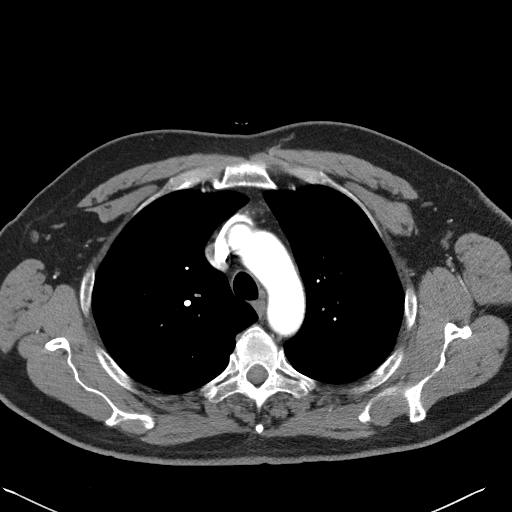
[im 98/113  lung]
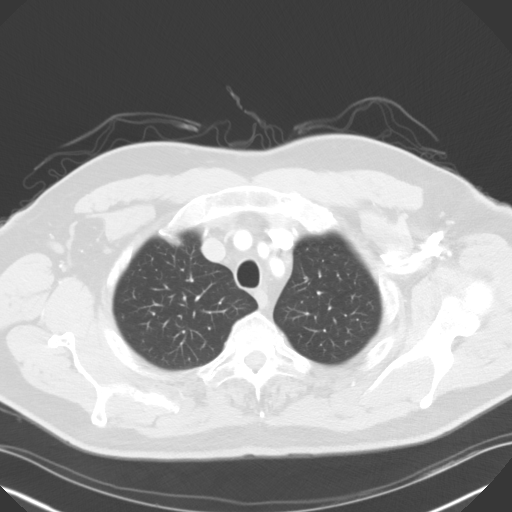
[im 105/113  soft-tissue]
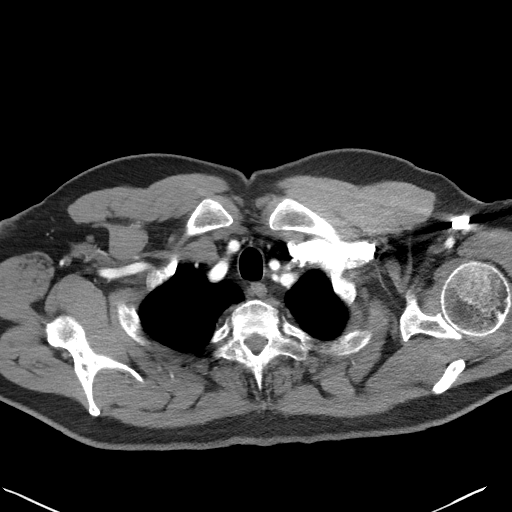

[Series 7: coronals · coronal · 0.67mm/px · 3 of 138 slices shown]
[im 35/138  soft-tissue]
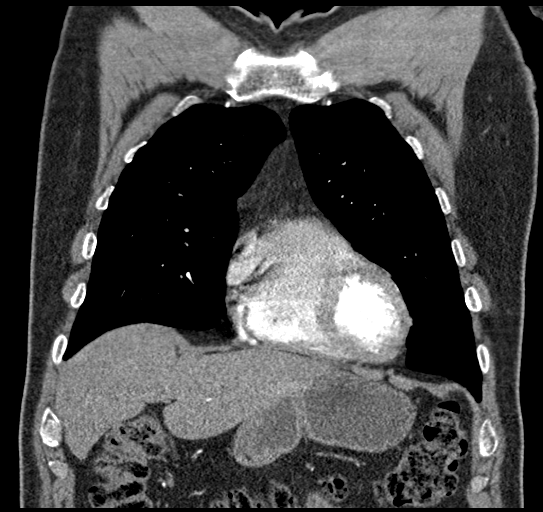
[im 69/138  soft-tissue]
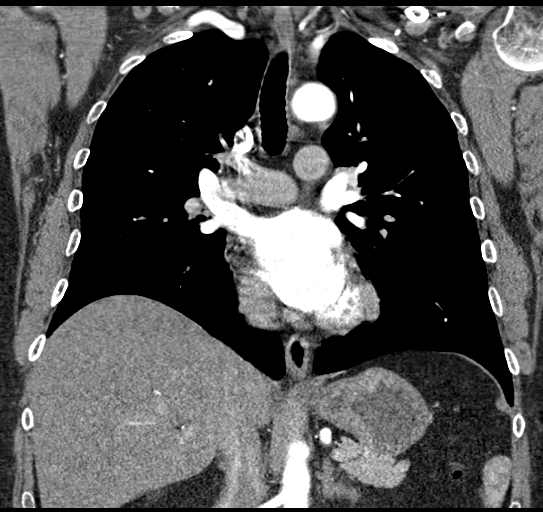
[im 103/138  soft-tissue]
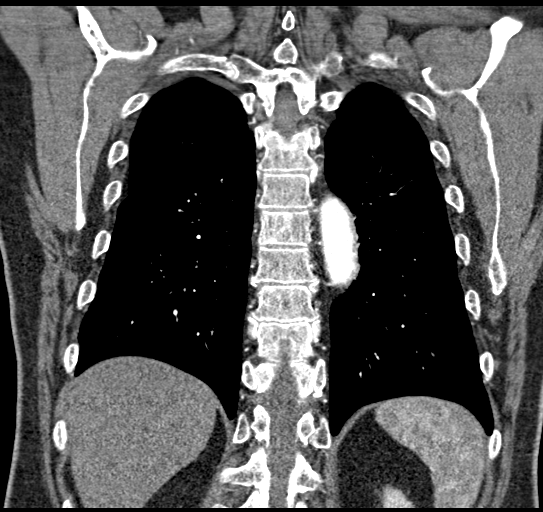

[17 of 46 positions shown; findings below may reference images not displayed]

FINDINGS: Cardiovascular: Fusiform aneurysmal dilatation of the tubular
portion of the ascending thoracic aorta with a maximal diameter of
4.8 cm. No evidence of dissection. Conventional 3 vessel arch
anatomy. Calcifications present along the coronary arteries. The
heart is normal in size. No pericardial effusion. Unremarkable
pulmonary venous drainage.

Mediastinum/Nodes: Unremarkable CT appearance of the thyroid gland.
No suspicious mediastinal or hilar adenopathy. No soft tissue
mediastinal mass. The thoracic esophagus is unremarkable. There are
a few small calcified right-sided hilar lymph nodes suggesting old
granulomatous disease.

Lungs/Pleura: The lungs are largely clear. However, there is a
solitary 5 mm pulmonary nodule within the right middle lobe (image
61, series 5). No evidence of emphysema, bronchial wall thickening,
pulmonary edema, pneumothorax or pleural effusion.

Upper Abdomen: No acute abnormality.

Musculoskeletal: No acute fracture or aggressive appearing lytic or
blastic osseous lesion.

Review of the MIP images confirms the above findings.
IMPRESSION: 1. Fusiform aneurysmal dilatation of the ascending thoracic aorta
with a maximal diameter of 4.8 cm. Ascending thoracic aortic
aneurysm. Recommend semi-annual imaging followup by CTA or MRA and
referral to cardiothoracic surgery if not already obtained. This
recommendation follows 4909
ACCF/AHA/AATS/ACR/ASA/SCA/HALPERN/MEGAN-JANE/DEHELJAN/JIM Guidelines for the
Diagnosis and Management of Patients With Thoracic Aortic Disease.
Circulation. 4909; 121: [id number]. Aortic aneurysm NOS
(GGATJ-T21.Q); Aortic Atherosclerosis (GGATJ-170.0)
2. Small 5 mm right middle lobe pulmonary nodule. No follow-up
needed if patient is low-risk. Non-contrast chest CT can be
considered in 12 months if patient is high-risk. This recommendation
follows the consensus statement: Guidelines for Management of
Incidental Pulmonary Nodules Detected on CT Images: From the
3. Coronary artery calcifications.

## 2020-02-12 NOTE — Progress Notes (Unsigned)
Cardiology Office Note   Date:  02/13/2020   ID:  Daniel Petty, DOB 1960-08-03, MRN [id number]  PCP:  Pincus Sanes, MD    No chief complaint on file.  Thoracic aortic aneurysm  Wt Readings from Last 3 Encounters:  02/13/20 235 lb (106.6 kg)  10/14/19 233 lb (105.7 kg)  06/21/19 225 lb 12.8 oz (102.4 kg)       History of Present Illness: Daniel Petty is a 60 y.o. male  Who had a h/o SVT/ AVNRT. He had am ablation in 2015 in [place], as he was living there at the time. He had syncope with HRs in the 200s prior to the ablation. Since the ablation, he has not had any episodes like this. Doing yardwork in the heat would cause these arrhythmias. He had a negative stress test.   Monitor in 2015 showed NSR with PACs, PVCs.   3/15: ETT negative ; 3/15 echo: normal LV function  He has had hyperlipidemia and was on Crestor but stopped thisdue to perceived muscle aches. He is watching his diet and the lipids are similar.Pain did not improve quickly.  No family h/o CAD. No early MI in the family.  Follows with Dr. Tyrone Sage for Thoracic aneurysm, records from 2020: "fusiform dilatation of the ascending aorta, 4.7 to 4.8 cm by CT scan, suggested at 4.52 cm 5 years ago by echocardiogram-patient has no stigmata of connective tissue disorder, no family history of dissection or sudden death,echocardiogram 2020/03/08confirms trileaflet aortic valve."  Since the last visit, he has felt well.  In 8/21, he had a few minutes of palpitations after bike riding, but they resolved on their own.  Not as severe as what he had before ablation.  Denies : Chest pain. Dizziness. Leg edema. Nitroglycerin use. Orthopnea. Paroxysmal nocturnal dyspnea. Shortness of breath. Syncope.   He rides his bike for exercise.  Left hip and right knee exercise.  Better with exercise.   Continues to follow with CT surgery- changing to once a year monitoring.    Back to travelling for  work.  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  BPs outside of MDs office average 115/70.         Past Medical History:  Diagnosis Date  . Arthritis   . Ascending aorta dilation (HCC)   . Hyperlipemia   . Hypertension   . Low testosterone   . Migraine   . Premature atrial contractions   . PSVT (paroxysmal supraventricular tachycardia) (HCC)    a. syncope/HR 200s -> SVT/AVNRT s/p ablation 2015.  Marland Kitchen PVC's (premature ventricular contractions)   . Syncope   . Tachycardia    6 [place]    Past Surgical History:  Procedure Laterality Date  . APPENDECTOMY    . CHOLECYSTECTOMY    . psvt typical    . TONSILLECTOMY    . typical AVNRT       Current Outpatient Medications  Medication Sig Dispense Refill  . amLODipine (NORVASC) 5 MG tablet TAKE 1 TABLET(5 MG) BY MOUTH DAILY 90 tablet 3  . ezetimibe (ZETIA) 10 MG tablet Take 1 tablet (10 mg total) by mouth daily. 90 tablet 3  . metoprolol succinate (TOPROL-XL) 25 MG 24 hr tablet TAKE 1 TABLET(25 MG) BY MOUTH DAILY 90 tablet 3  . Ubrogepant (UBRELVY) 50 MG TABS Take 50 mg once, may repeat after 1 hour x 1 prn 10 tablet 1  No current facility-administered medications for this visit.    Allergies:   Penicillins, Quinolones, Statins, Yellow jacket venom, and Sulfa antibiotics    Social History:  The patient  reports that he has never smoked. He has never used smokeless tobacco. He reports current alcohol use. He reports that he does not use drugs.   Family History:  The patient's family history includes Allergies in his father and mother; Arrhythmia in his mother; Heart attack in his mother; Heart disease in his father; Hyperlipidemia in his maternal grandmother and mother; Multiple sclerosis in his brother and mother.    ROS:  Please see the history of present illness.   Otherwise, review of systems are positive for rare palpitation.   All other systems are reviewed and  negative.    PHYSICAL EXAM: VS:  BP (!) 142/90   Pulse 61   Ht 6\' 2"  (1.88 m)   Wt 235 lb (106.6 kg)   SpO2 99%   BMI 30.17 kg/m  , BMI Body mass index is 30.17 kg/m. GEN: Well nourished, well developed, in no acute distress  HEENT: normal  Neck: no JVD, carotid bruits, or masses Cardiac: RRR; no murmurs, rubs, or gallops,no edema  Respiratory:  clear to auscultation bilaterally, normal work of breathing GI: soft, nontender, nondistended, + BS MS: no deformity or atrophy  Skin: warm and dry, no rash Neuro:  Strength and sensation are intact Psych: euthymic mood, full affect   EKG:   The ekg ordered today demonstrates NSR, no ST segment changes   Recent Labs: 08/02/2019: ALT 14; BUN 15; Creatinine, Ser 0.76; Potassium 4.2; Sodium 140 10/14/2019: Hemoglobin 14.6; Platelets 206   Lipid Panel    Component Value Date/Time   CHOL 125 08/02/2019 0746   CHOL 192 02/15/2019 0853   TRIG 122.0 08/02/2019 0746   HDL 30.90 (L) 08/02/2019 0746   HDL 34 (L) 02/15/2019 0853   CHOLHDL 4 08/02/2019 0746   VLDL 24.4 08/02/2019 0746   LDLCALC 70 08/02/2019 0746   LDLCALC 130 (H) 02/15/2019 0853     Other studies Reviewed: Additional studies/ records that were reviewed today with results demonstrating: LDL 70 in 6/21.   ASSESSMENT AND PLAN:  1. SVT: Status post ablation.  Known PACs and PVCs in the past as well.  Sx well controlled.  2. Thoracic aortic aneurysm: Avoid heavy lifting.  Continue beta-blocker.   3. Coronary calcification: Intolerant of Crestor and Zocor for prevention.  Was on Crestor for years but then developed "bone pain" that resolved over time.  Whole food, plant-based diet recommended.  Tolerating Zetia.  4. HTN: The current medical regimen is effective;  continue present plan and medications.    Current medicines are reviewed at length with the patient today.  The patient concerns regarding his medicines were addressed.  The following changes have been made:   No change  Labs/ tests ordered today include:  No orders of the defined types were placed in this encounter.   Recommend 150 minutes/week of aerobic exercise Low fat, low carb, high fiber diet recommended  Disposition:   FU in 1 year   Signed, 7/21, MD  02/13/2020 11:41 AM    [facility] [address] [place], [place], [place]  [place] Phone: [phone]; Fax: [phone]

## 2020-02-13 ENCOUNTER — Ambulatory Visit (INDEPENDENT_AMBULATORY_CARE_PROVIDER_SITE_OTHER): Payer: Managed Care, Other (non HMO) | Admitting: Interventional Cardiology

## 2020-02-13 ENCOUNTER — Encounter: Payer: Self-pay | Admitting: Interventional Cardiology

## 2020-02-13 ENCOUNTER — Other Ambulatory Visit: Payer: Self-pay

## 2020-02-13 VITALS — BP 142/90 | HR 61 | Ht 74.0 in | Wt 235.0 lb

## 2020-02-13 DIAGNOSIS — I471 Supraventricular tachycardia: Secondary | ICD-10-CM | POA: Diagnosis not present

## 2020-02-13 DIAGNOSIS — I493 Ventricular premature depolarization: Secondary | ICD-10-CM

## 2020-02-13 DIAGNOSIS — I712 Thoracic aortic aneurysm, without rupture, unspecified: Secondary | ICD-10-CM

## 2020-02-13 DIAGNOSIS — I10 Essential (primary) hypertension: Secondary | ICD-10-CM | POA: Diagnosis not present

## 2020-02-13 DIAGNOSIS — I491 Atrial premature depolarization: Secondary | ICD-10-CM

## 2020-02-13 NOTE — Patient Instructions (Addendum)
Medication Instructions:  Your physician recommends that you continue on your current medications as directed. Please refer to the Current Medication list given to you today.  *If you need a refill on your cardiac medications before your next appointment, please call your pharmacy*   Lab Work: If you have labs (blood work) drawn today and your tests are completely normal, you will receive your results only by: Marland Kitchen MyChart Message (if you have MyChart) OR . A paper copy in the mail If you have any lab test that is abnormal or we need to change your treatment, we will call you to review the results.   Follow-Up: At [facility], you and your health needs are our priority.  As part of our continuing mission to provide you with exceptional heart care, we have created designated Provider Care Teams.  These Care Teams include your primary Cardiologist (physician) and Advanced Practice Providers (APPs -  Physician Assistants and Nurse Practitioners) who all work together to provide you with the care you need, when you need it.  We recommend signing up for the patient portal called "MyChart".  Sign up information is provided on this After Visit Summary.  MyChart is used to connect with patients for Virtual Visits (Telemedicine).  Patients are able to view lab/test results, encounter notes, upcoming appointments, etc.  Non-urgent messages can be sent to your provider as well.   To learn more about what you can do with MyChart, go to [email or web address].    Your next appointment:   1 year(s)  The format for your next appointment:   In Person  Provider:   You may see Lance Muss, MD or one of the following Advanced Practice Providers on your designated Care Team:    Ronie Spies, PA-C  Jacolyn Reedy, PA-C     High-Fiber Diet Fiber, also called dietary fiber, is a type of carbohydrate that is found in fruits, vegetables, whole grains, and beans. A high-fiber diet can have many health  benefits. Your health care provider may recommend a high-fiber diet to help:  Prevent constipation. Fiber can make your bowel movements more regular.  Lower your cholesterol.  Relieve the following conditions: ? Swelling of veins in the anus (hemorrhoids). ? Swelling and irritation (inflammation) of specific areas of the digestive tract (uncomplicated diverticulosis). ? A problem of the large intestine (colon) that sometimes causes pain and diarrhea (irritable bowel syndrome, IBS).  Prevent overeating as part of a weight-loss plan.  Prevent heart disease, type 2 diabetes, and certain cancers. What is my plan? The recommended daily fiber intake in grams (g) includes:  38 g for men age 57 or younger.  30 g for men over age 39.  25 g for women age 71 or younger.  21 g for women over age 1. You can get the recommended daily intake of dietary fiber by:  Eating a variety of fruits, vegetables, grains, and beans.  Taking a fiber supplement, if it is not possible to get enough fiber through your diet. What do I need to know about a high-fiber diet?  It is better to get fiber through food sources rather than from fiber supplements. There is not a lot of research about how effective supplements are.  Always check the fiber content on the nutrition facts label of any prepackaged food. Look for foods that contain 5 g of fiber or more per serving.  Talk with a diet and nutrition specialist (dietitian) if you have questions about specific foods that are  recommended or not recommended for your medical condition, especially if those foods are not listed below.  Gradually increase how much fiber you consume. If you increase your intake of dietary fiber too quickly, you may have bloating, cramping, or gas.  Drink plenty of water. Water helps you to digest fiber. What are tips for following this plan?  Eat a wide variety of high-fiber foods.  Make sure that half of the grains that you eat  each day are whole grains.  Eat breads and cereals that are made with whole-grain flour instead of refined flour or white flour.  Eat brown rice, bulgur wheat, or millet instead of white rice.  Start the day with a breakfast that is high in fiber, such as a cereal that contains 5 g of fiber or more per serving.  Use beans in place of meat in soups, salads, and pasta dishes.  Eat high-fiber snacks, such as berries, raw vegetables, nuts, and popcorn.  Choose whole fruits and vegetables instead of processed forms like juice or sauce. What foods can I eat?  Fruits Berries. Pears. Apples. Oranges. Avocado. Prunes and raisins. Dried figs. Vegetables Sweet potatoes. Spinach. Kale. Artichokes. Cabbage. Broccoli. Cauliflower. Green peas. Carrots. Squash. Grains Whole-grain breads. Multigrain cereal. Oats and oatmeal. Brown rice. Barley. Bulgur wheat. Millet. Quinoa. Bran muffins. Popcorn. Rye wafer crackers. Meats and other proteins Navy, kidney, and pinto beans. Soybeans. Split peas. Lentils. Nuts and seeds. Dairy Fiber-fortified yogurt. Beverages Fiber-fortified soy milk. Fiber-fortified orange juice. Other foods Fiber bars. The items listed above may not be a complete list of recommended foods and beverages. Contact a dietitian for more options. What foods are not recommended? Fruits Fruit juice. Cooked, strained fruit. Vegetables Fried potatoes. Canned vegetables. Well-cooked vegetables. Grains White bread. Pasta made with refined flour. White rice. Meats and other proteins Fatty cuts of meat. Fried chicken or fried fish. Dairy Milk. Yogurt. Cream cheese. Sour cream. Fats and oils Butters. Beverages Soft drinks. Other foods Cakes and pastries. The items listed above may not be a complete list of foods and beverages to avoid. Contact a dietitian for more information. Summary  Fiber is a type of carbohydrate. It is found in fruits, vegetables, whole grains, and  beans.  There are many health benefits of eating a high-fiber diet, such as preventing constipation, lowering blood cholesterol, helping with weight loss, and reducing your risk of heart disease, diabetes, and certain cancers.  Gradually increase your intake of fiber. Increasing too fast can result in cramping, bloating, and gas. Drink plenty of water while you increase your fiber.  The best sources of fiber include whole fruits and vegetables, whole grains, nuts, seeds, and beans. This information is not intended to replace advice given to you by your health care provider. Make sure you discuss any questions you have with your health care provider. Document Revised: 12/01/2016 Document Reviewed: 12/01/2016 Elsevier Patient Education  2020 [employer].

## 2020-03-08 ENCOUNTER — Other Ambulatory Visit: Payer: Self-pay

## 2020-03-08 DIAGNOSIS — I10 Essential (primary) hypertension: Secondary | ICD-10-CM

## 2020-03-08 MED ORDER — AMLODIPINE BESYLATE 5 MG PO TABS: ORAL_TABLET | ORAL | 3 refills | Status: DC

## 2020-03-12 ENCOUNTER — Other Ambulatory Visit: Payer: Self-pay

## 2020-03-12 MED ORDER — METOPROLOL SUCCINATE ER 25 MG PO TB24: ORAL_TABLET | ORAL | 3 refills | Status: DC

## 2020-04-18 ENCOUNTER — Other Ambulatory Visit: Payer: Self-pay | Admitting: *Deleted

## 2020-04-18 DIAGNOSIS — I712 Thoracic aortic aneurysm, without rupture, unspecified: Secondary | ICD-10-CM

## 2020-06-05 ENCOUNTER — Other Ambulatory Visit: Payer: Self-pay

## 2020-06-05 NOTE — Patient Instructions (Addendum)
Blood work was ordered.     Medications changes include : none    Your prescription(s) have been submitted to your pharmacy. Please take as directed and contact our office if you believe you are having problem(s) with the medication(s).   A referral was ordered for Dr Tat.        Someone from their office will call you to schedule an appointment.    Please followup in 1 year    Health Maintenance, Male Adopting a healthy lifestyle and getting preventive care are important in promoting health and wellness. Ask your health care provider about:  The right schedule for you to have regular tests and exams.  Things you can do on your own to prevent diseases and keep yourself healthy. What should I know about diet, weight, and exercise? Eat a healthy diet  Eat a diet that includes plenty of vegetables, fruits, low-fat dairy products, and lean protein.  Do not eat a lot of foods that are high in solid fats, added sugars, or sodium.   Maintain a healthy weight Body mass index (BMI) is a measurement that can be used to identify possible weight problems. It estimates body fat based on height and weight. Your health care provider can help determine your BMI and help you achieve or maintain a healthy weight. Get regular exercise Get regular exercise. This is one of the most important things you can do for your health. Most adults should:  Exercise for at least 150 minutes each week. The exercise should increase your heart rate and make you sweat (moderate-intensity exercise).  Do strengthening exercises at least twice a week. This is in addition to the moderate-intensity exercise.  Spend less time sitting. Even light physical activity can be beneficial. Watch cholesterol and blood lipids Have your blood tested for lipids and cholesterol at 62 years of age, then have this test every 5 years. You may need to have your cholesterol levels checked more often if:  Your lipid or cholesterol  levels are high.  You are older than 62 years of age.  You are at high risk for heart disease. What should I know about cancer screening? Many types of cancers can be detected early and may often be prevented. Depending on your health history and family history, you may need to have cancer screening at various ages. This may include screening for:  Colorectal cancer.  Prostate cancer.  Skin cancer.  Lung cancer. What should I know about heart disease, diabetes, and high blood pressure? Blood pressure and heart disease  High blood pressure causes heart disease and increases the risk of stroke. This is more likely to develop in people who have high blood pressure readings, are of African descent, or are overweight.  Talk with your health care provider about your target blood pressure readings.  Have your blood pressure checked: ? Every 3-5 years if you are 62-61 years of age. ? Every year if you are 22 years old or older.  If you are between the ages of 73 and 34 and are a current or former smoker, ask your health care provider if you should have a one-time screening for abdominal aortic aneurysm (AAA). Diabetes Have regular diabetes screenings. This checks your fasting blood sugar level. Have the screening done:  Once every three years after age 14 if you are at a normal weight and have a low risk for diabetes.  More often and at a younger age if you are overweight or have a  high risk for diabetes. What should I know about preventing infection? Hepatitis B If you have a higher risk for hepatitis B, you should be screened for this virus. Talk with your health care provider to find out if you are at risk for hepatitis B infection. Hepatitis C Blood testing is recommended for:  Everyone born from [birth year] through 1967.  Anyone with known risk factors for hepatitis C. Sexually transmitted infections (STIs)  You should be screened each year for STIs, including gonorrhea and  chlamydia, if: ? You are sexually active and are younger than 62 years of age. ? You are older than 62 years of age and your health care provider tells you that you are at risk for this type of infection. ? Your sexual activity has changed since you were last screened, and you are at increased risk for chlamydia or gonorrhea. Ask your health care provider if you are at risk.  Ask your health care provider about whether you are at high risk for HIV. Your health care provider may recommend a prescription medicine to help prevent HIV infection. If you choose to take medicine to prevent HIV, you should first get tested for HIV. You should then be tested every 3 months for as long as you are taking the medicine. Follow these instructions at home: Lifestyle  Do not use any products that contain nicotine or tobacco, such as cigarettes, e-cigarettes, and chewing tobacco. If you need help quitting, ask your health care provider.  Do not use street drugs.  Do not share needles.  Ask your health care provider for help if you need support or information about quitting drugs. Alcohol use  Do not drink alcohol if your health care provider tells you not to drink.  If you drink alcohol: ? Limit how much you have to 0-2 drinks a day. ? Be aware of how much alcohol is in your drink. In the [place] one drink equals one 12 oz bottle of beer (355 mL), one 5 oz glass of wine (148 mL), or one 1 oz glass of hard liquor (44 mL). General instructions  Schedule regular health, dental, and eye exams.  Stay current with your vaccines.  Tell your health care provider if: ? You often feel depressed. ? You have ever been abused or do not feel safe at home. Summary  Adopting a healthy lifestyle and getting preventive care are important in promoting health and wellness.  Follow your health care provider's instructions about healthy diet, exercising, and getting tested or screened for diseases.  Follow your health  care provider's instructions on monitoring your cholesterol and blood pressure. This information is not intended to replace advice given to you by your health care provider. Make sure you discuss any questions you have with your health care provider. Document Revised: 01/20/2018 Document Reviewed: 01/20/2018 Elsevier Patient Education  2021 [employer].

## 2020-06-05 NOTE — Progress Notes (Signed)
Subjective:    Patient ID: Daniel Petty, male    DOB: 10-Jan-1961, 60 y.o.   MRN: [id number]  HPI He is here for a physical exam.   Last 6 months stiffness, slight weakness in his bilateral lower extremities.  He feels it more in his upper legs.  He denies any arm or back muscle pain or weakness.  Initially thought that this was related to getting older.  He does bike year-round and has started to increase in his biking to see if that helps.  He denies any leg weakness when he is biking.    He has also noticed a very mild tremor in his hands that is very transient-they last 10-15 seconds.  It is so transient is hard for him to tell if it is more at rest or with action.  His wife pointed out to him that she also noticed that his head also had a tremor, but he has never noticed that.  He denies a family history of benign essential tremor.  His mother and brother both have multiple sclerosis.   He has known OA in hip and knee,   He can not do a squat anymore- can not get up.  That is new and he relates the weakness in the legs.       Medications and allergies reviewed with patient and updated if appropriate.  Patient Active Problem List   Diagnosis Date Noted  . Myalgia 06/06/2020  . Recurrent cellulitis of lower leg 10/14/2019  . Hyperglycemia 06/21/2019  . Mixed hyperlipidemia 02/17/2018  . Pulmonary nodule seen on imaging study 02/17/2018  . Ascending aorta dilatation (HCC) 01/11/2018  . Cough variant asthma 06/18/2017  . SVT (supraventricular tachycardia) (HCC) 10/07/2016  . Low testosterone 09/19/2016  . Arthralgia 09/17/2016  . PAC (premature atrial contraction) 09/13/2015  . PVC (premature ventricular contraction) 09/13/2015  . Migraines 05/14/2015  . Osteoarthritis 05/14/2015  . History of cardiac radiofrequency ablation 05/14/2015  . Essential hypertension, benign 05/14/2015    Current Outpatient Medications on File Prior to Visit  Medication Sig Dispense Refill   . amLODipine (NORVASC) 5 MG tablet TAKE 1 TABLET(5 MG) BY MOUTH DAILY 90 tablet 3  . metoprolol succinate (TOPROL-XL) 25 MG 24 hr tablet TAKE 1 TABLET(25 MG) BY MOUTH DAILY 90 tablet 3  . Testosterone (ANDRODERM) 2 MG/24HR PT24 Androderm 2 mg/24 hour transdermal 24 hour patch  APP 1 PA ON SKIN D.    . Ubrogepant (UBRELVY) 50 MG TABS Take 50 mg once, may repeat after 1 hour x 1 prn 10 tablet 1   No current facility-administered medications on file prior to visit.    Past Medical History:  Diagnosis Date  . Arthritis   . Ascending aorta dilation (HCC)   . Hyperlipemia   . Hypertension   . Low testosterone   . Migraine   . Premature atrial contractions   . PSVT (paroxysmal supraventricular tachycardia) (HCC)    a. syncope/HR 200s -> SVT/AVNRT s/p ablation 2015.  Marland Kitchen PVC's (premature ventricular contractions)   . Syncope   . Tachycardia    6 years ago [place] in [place]    Past Surgical History:  Procedure Laterality Date  . APPENDECTOMY    . CHOLECYSTECTOMY    . psvt typical    . TONSILLECTOMY    . typical AVNRT      Social History   Socioeconomic History  . Marital status: Married    Spouse name: Not on file  . Number  of children: Not on file  . Years of education: Not on file  . Highest education level: Not on file  Occupational History  . Not on file  Tobacco Use  . Smoking status: Never Smoker  . Smokeless tobacco: Never Used  Vaping Use  . Vaping Use: Never used  Substance and Sexual Activity  . Alcohol use: Yes  . Drug use: No  . Sexual activity: Not on file  Other Topics Concern  . Not on file  Social History Narrative   Exercise: regular   Social Determinants of Health   Financial Resource Strain: Not on file  Food Insecurity: Not on file  Transportation Needs: Not on file  Physical Activity: Not on file  Stress: Not on file  Social Connections: Not on file    Family History  Problem Relation Age of Onset  . Hyperlipidemia Mother   . Heart  attack Mother   . Multiple sclerosis Mother   . Arrhythmia Mother   . Allergies Mother   . Heart disease Father   . Allergies Father   . Multiple sclerosis Brother   . Hyperlipidemia Maternal Grandmother     Review of Systems  Constitutional: Negative for chills and fever.  Eyes: Negative for visual disturbance.  Respiratory: Positive for cough (annual cough in Jan-Feb). Negative for shortness of breath and wheezing.   Cardiovascular: Negative for chest pain, palpitations and leg swelling.  Gastrointestinal: Negative for abdominal pain, blood in stool, constipation, diarrhea and nausea.       No gerd  Genitourinary: Negative for difficulty urinating, dysuria and hematuria.  Musculoskeletal: Positive for arthralgias (right knee, right shoulder, left hip), back pain (lower back tightness) and myalgias (legs - achy discomfort).  Skin: Negative for color change and rash.  Neurological: Positive for weakness and headaches (migraines). Negative for dizziness, light-headedness and numbness.  Psychiatric/Behavioral: Negative for dysphoric mood. The patient is not nervous/anxious.        Objective:   Vitals:   06/06/20 1311  BP: 140/88  Pulse: 67  Temp: 98.1 F (36.7 C)  SpO2: 98%   Filed Weights   06/06/20 1311  Weight: 228 lb (103.4 kg)   Body mass index is 29.27 kg/m.  BP Readings from Last 3 Encounters:  06/06/20 140/88  02/13/20 (!) 142/90  10/14/19 128/82    Wt Readings from Last 3 Encounters:  06/06/20 228 lb (103.4 kg)  02/13/20 235 lb (106.6 kg)  10/14/19 233 lb (105.7 kg)     Physical Exam Constitutional: He appears well-developed and well-nourished. No distress.  HENT:  Head: Normocephalic and atraumatic.  Right Ear: External ear normal.  Left Ear: External ear normal.  Mouth/Throat: Oropharynx is clear and moist.  Normal ear canals and TM b/l  Eyes: Conjunctivae and EOM are normal.  Neck: Neck supple. No tracheal deviation present. No thyromegaly  present.  No carotid bruit  Cardiovascular: Normal rate, regular rhythm, normal heart sounds and intact distal pulses.   No murmur heard. Pulmonary/Chest: Effort normal and breath sounds normal. No respiratory distress. He has no wheezes. He has no rales.  Abdominal: Soft. He exhibits no distension. There is no tenderness.  Genitourinary: deferred to urology Musculoskeletal: He exhibits no edema.  Lymphadenopathy:   He has no cervical adenopathy.  Skin: Skin is warm and dry. He is not diaphoretic.  Psychiatric: He has a normal mood and affect. His behavior is normal.         Assessment & Plan:   Physical exam: Screening  blood work  ordered Immunizations  ? tdap due, had J& J and a booster, discussed shingrix Colonoscopy   Done in 2015 Eye exams   Up to date  Exercise  Biking     Weight  Ok for age Substance abuse   none  See Problem List for Assessment and Plan of chronic medical problems.   This visit occurred during the SARS-CoV-2 public health emergency.  Safety protocols were in place, including screening questions prior to the visit, additional usage of staff PPE, and extensive cleaning of exam room while observing appropriate contact time as indicated for disinfecting solutions.

## 2020-06-06 ENCOUNTER — Other Ambulatory Visit: Payer: Self-pay

## 2020-06-06 ENCOUNTER — Encounter: Payer: Self-pay | Admitting: Internal Medicine

## 2020-06-06 ENCOUNTER — Ambulatory Visit (INDEPENDENT_AMBULATORY_CARE_PROVIDER_SITE_OTHER): Payer: Managed Care, Other (non HMO) | Admitting: Internal Medicine

## 2020-06-06 VITALS — BP 140/88 | HR 67 | Temp 98.1°F | Ht 74.0 in | Wt 228.0 lb

## 2020-06-06 DIAGNOSIS — G43109 Migraine with aura, not intractable, without status migrainosus: Secondary | ICD-10-CM | POA: Diagnosis not present

## 2020-06-06 DIAGNOSIS — Z0000 Encounter for general adult medical examination without abnormal findings: Secondary | ICD-10-CM | POA: Diagnosis not present

## 2020-06-06 DIAGNOSIS — R739 Hyperglycemia, unspecified: Secondary | ICD-10-CM

## 2020-06-06 DIAGNOSIS — E782 Mixed hyperlipidemia: Secondary | ICD-10-CM | POA: Diagnosis not present

## 2020-06-06 DIAGNOSIS — M791 Myalgia, unspecified site: Secondary | ICD-10-CM | POA: Diagnosis not present

## 2020-06-06 DIAGNOSIS — R251 Tremor, unspecified: Secondary | ICD-10-CM

## 2020-06-06 DIAGNOSIS — I10 Essential (primary) hypertension: Secondary | ICD-10-CM

## 2020-06-06 DIAGNOSIS — I77810 Thoracic aortic ectasia: Secondary | ICD-10-CM

## 2020-06-06 LAB — CBC WITH DIFFERENTIAL/PLATELET
Basophils Absolute: 0 10*3/uL (ref 0.0–0.1)
Basophils Relative: 0.6 % (ref 0.0–3.0)
Eosinophils Absolute: 0.2 10*3/uL (ref 0.0–0.7)
Eosinophils Relative: 2.9 % (ref 0.0–5.0)
HCT: 45.6 % (ref 39.0–52.0)
Hemoglobin: 15.3 g/dL (ref 13.0–17.0)
Lymphocytes Relative: 30.9 % (ref 12.0–46.0)
Lymphs Abs: 2.6 10*3/uL (ref 0.7–4.0)
MCHC: 33.6 g/dL (ref 30.0–36.0)
MCV: 89.1 fl (ref 78.0–100.0)
Monocytes Absolute: 0.7 10*3/uL (ref 0.1–1.0)
Monocytes Relative: 8.2 % (ref 3.0–12.0)
Neutro Abs: 4.8 10*3/uL (ref 1.4–7.7)
Neutrophils Relative %: 57.4 % (ref 43.0–77.0)
Platelets: 245 10*3/uL (ref 150.0–400.0)
RBC: 5.12 Mil/uL (ref 4.22–5.81)
RDW: 13.6 % (ref 11.5–15.5)
WBC: 8.3 10*3/uL (ref 4.0–10.5)

## 2020-06-06 LAB — COMPREHENSIVE METABOLIC PANEL
ALT: 21 U/L (ref 0–53)
AST: 19 U/L (ref 0–37)
Albumin: 4.4 g/dL (ref 3.5–5.2)
Alkaline Phosphatase: 102 U/L (ref 39–117)
BUN: 9 mg/dL (ref 6–23)
CO2: 27 mEq/L (ref 19–32)
Calcium: 9.2 mg/dL (ref 8.4–10.5)
Chloride: 102 mEq/L (ref 96–112)
Creatinine, Ser: 0.76 mg/dL (ref 0.40–1.50)
GFR: 96.74 mL/min (ref >60.00)
Glucose, Bld: 86 mg/dL (ref 70–99)
Potassium: 3.9 mEq/L (ref 3.5–5.1)
Sodium: 138 mEq/L (ref 135–145)
Total Bilirubin: 1.3 mg/dL — ABNORMAL HIGH (ref 0.2–1.2)
Total Protein: 7.1 g/dL (ref 6.0–8.3)

## 2020-06-06 LAB — C-REACTIVE PROTEIN: CRP: <1 mg/dL (ref 0.5–20.0)

## 2020-06-06 LAB — LIPID PANEL
Cholesterol: 143 mg/dL (ref 0–200)
HDL: 32.1 mg/dL — ABNORMAL LOW (ref >39.00)
NonHDL: 111.13
Total CHOL/HDL Ratio: 4
Triglycerides: 201 mg/dL — ABNORMAL HIGH (ref 0.0–149.0)
VLDL: 40.2 mg/dL — ABNORMAL HIGH (ref 0.0–40.0)

## 2020-06-06 LAB — TSH: TSH: 1.9 u[IU]/mL (ref 0.35–4.50)

## 2020-06-06 LAB — LDL CHOLESTEROL, DIRECT: Direct LDL: 101 mg/dL

## 2020-06-06 LAB — SEDIMENTATION RATE: Sed Rate: 3 mm/hr (ref 0–20)

## 2020-06-06 MED ORDER — EPINEPHRINE 0.3 MG/0.3ML IJ SOAJ: 0.3000 mg | INTRAMUSCULAR | 5 refills | Status: AC | PRN

## 2020-06-06 MED ORDER — EZETIMIBE 10 MG PO TABS: 10.0000 mg | ORAL_TABLET | Freq: Every day | ORAL | 3 refills | Status: DC

## 2020-06-06 NOTE — Assessment & Plan Note (Signed)
Chronic A1c has been normal in the past Will check CMP

## 2020-06-06 NOTE — Assessment & Plan Note (Addendum)
Chronic BP well controlled at home 120-130/75-80, slightly elevated here Continue metoprolol XL 25 mg daily, amlodipine 5 mg daily Continue to monitor BP at home cmp

## 2020-06-06 NOTE — Assessment & Plan Note (Signed)
Acute Has noticed muscle weakness and achiness along with lower extremity stiffness Denies upper body or back muscle symptoms Having difficulty getting up from a squat, which she has been able to do in the past without difficulty Able to bike regularly without difficulty ESR, CRP ?  Related to possible tremor Refer to Dr. Carles Collet for further evaluation

## 2020-06-06 NOTE — Assessment & Plan Note (Signed)
Chronic Statin intolerant Continue Zetia 10 mg daily Check lipid panel, CMP, TSH Continue healthy diet and regular exercise

## 2020-06-06 NOTE — Assessment & Plan Note (Signed)
Chronic Migraines Q 3 weeks More frequent, but painfree  - always get aura in R eye with associated mild headache and feels disassociated - take excedrin and resolves after 2-3 hours In the past they were much more severe Has not tried the [employer]

## 2020-06-06 NOTE — Assessment & Plan Note (Signed)
Chronic Monitored by cardiothoracic surgery 

## 2020-06-06 NOTE — Assessment & Plan Note (Signed)
Acute Has noticed a very mild, transient tremor in his hands and his wife noticed it in his head No family history Also having lower extremity stiffness and weakness in upper leg muscles ESR, CRP Will refer to neurology for further evaluation

## 2020-06-07 ENCOUNTER — Ambulatory Visit (INDEPENDENT_AMBULATORY_CARE_PROVIDER_SITE_OTHER): Payer: Managed Care, Other (non HMO) | Admitting: Cardiothoracic Surgery

## 2020-06-07 ENCOUNTER — Ambulatory Visit
Admission: RE | Admit: 2020-06-07 | Discharge: 2020-06-07 | Disposition: A | Discharge status: Home / Self Care | Payer: Managed Care, Other (non HMO) | Source: Ambulatory Visit | Attending: Cardiothoracic Surgery | Admitting: Cardiothoracic Surgery

## 2020-06-07 VITALS — BP 150/90 | HR 70 | Resp 20 | Ht 74.0 in | Wt 228.0 lb

## 2020-06-07 DIAGNOSIS — I712 Thoracic aortic aneurysm, without rupture, unspecified: Secondary | ICD-10-CM

## 2020-06-07 MED ORDER — IOPAMIDOL (ISOVUE-370) INJECTION 76%
75.0000 mL | Freq: Once | INTRAVENOUS | Status: AC | PRN
  Administered 2020-06-07: 75 mL via INTRAVENOUS

## 2020-06-07 NOTE — Progress Notes (Signed)
      [address]       [place] [address]             [phone]     CARDIOTHORACIC SURGERY OFFICE NOTE  Referring Provider is Laurann Montana, PA-C Primary Cardiologist is Lance Muss, MD PCP is Pincus Sanes, MD   HPI:  60 year old man had an incidentally discovered ascending aortic aneurysm.  He has previously been followed by Dr. Tyrone Sage.  This has been stable over time.  He presents for regular surveillance.  His blood pressure he states is typically well controlled at home but it has been inching up a little bit with the last couple of office visits.  He denies any chest pain or shortness of breath.   Current Outpatient Medications  Medication Sig Dispense Refill  . amLODipine (NORVASC) 5 MG tablet TAKE 1 TABLET(5 MG) BY MOUTH DAILY 90 tablet 3  . EPINEPHrine 0.3 mg/0.3 mL IJ SOAJ injection Inject 0.3 mg into the muscle as needed for anaphylaxis. 1 each 5  . ezetimibe (ZETIA) 10 MG tablet Take 1 tablet (10 mg total) by mouth daily. 90 tablet 3  . metoprolol succinate (TOPROL-XL) 25 MG 24 hr tablet TAKE 1 TABLET(25 MG) BY MOUTH DAILY 90 tablet 3  . Testosterone (ANDRODERM) 2 MG/24HR PT24 Androderm 2 mg/24 hour transdermal 24 hour patch  APP 1 PA ON SKIN D.    . Ubrogepant (UBRELVY) 50 MG TABS Take 50 mg once, may repeat after 1 hour x 1 prn 10 tablet 1   No current facility-administered medications for this visit.      Physical Exam:   BP (!) 150/90   Pulse 70   Resp 20   Ht 6\' 2"  (1.88 m)   Wt 103.4 kg   SpO2 97% Comment: RA  BMI 29.27 kg/m   General:  Well-appearing man no acute distress  Chest:   Clear to auscultation  CV:   Regular rate and rhythm  Abdomen:  Soft nontender  Extremities:  Well-perfused  Diagnostic Tests:  I personally reviewed his available imaging studies from today which demonstrates a stable ascending aortic aneurysm   Impression:  60 year old with hypertension and incidentally discovered ascending aortic  aneurysm which is stable in size at approximately 4.7 cm  Plan:  Follow-up in 18 months with noncontrast chest CT Continue strict blood pressure control aiming for a diastolic less than 90 mmHg  I spent in excess of 15 minutes during the conduct of this office consultation and >50% of this time involved direct face-to-face encounter with the patient for counseling and/or coordination of their care.  Level 2                 10 minutes Level 3                 15 minutes Level 4                 25 minutes Level 5                 40 minutes  B.  77, MD 06/07/2020 2:22 PM

## 2020-06-11 ENCOUNTER — Other Ambulatory Visit: Payer: Self-pay | Admitting: Internal Medicine

## 2020-06-11 NOTE — Progress Notes (Signed)
Assessment/Plan:   1.  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  He also has low back pain and a feeling of spasticity (although not identified on exam).  We will go ahead and do an MRI of the brain (strong family history of MS, and both his mother and brother, although this would be unusual in this age group).  2.  LBP with feeling of heaviness in the legs and a few falls  -We will do MRI of the lumbar spine  -If above is negative, we will go ahead and pursue EMG  -We will do CPK and B12  -Could consider MRI of the cervical spine if above is negative, but really did not see anything suggesting cervical myelopathy on exam today.  Subjective:   Daniel Petty was seen today in the movement disorders clinic for neurologic consultation at the request of Burns, Bobette Mo, MD.  The consultation is for the evaluation of tremor and stiffness. Outside records that were made available to me were reviewed.  Pt states that he has developed LE tightness/stiffness.  He states that his mother and brother both have MS.  His stiffness has gotten worse, so much that he wakes up with spasms.  He has fallen 2 times in the last month - both outside in a pothole - but he would not have expected it to cause a fall.     Both legs feel like they are lead like and in cement.  The legs feel achy and tight.  It is better with movement.  There is back pain.  Slower going up/down stairs but is able to do so.  He is on no statin medications.  Bladder and bowel are under good control.  No new meds.  Sx's x 1 year but bothersome x last few months.  Tremor: Yes.   , was rare and only recalls it 2 times - only on R side when holding a piece of paper.  It went away when moved the arm.  Wife noted head tremor.    Other Specific Symptoms:  Voice: no change Sleep: sleeps well Bradykinesia symptoms: slow movements Loss of smell:  No. Loss  of taste:  No. Urinary Incontinence:  No. Difficulty Swallowing:  No. Handwriting, micrographia: No. Trouble with ADL's:  No.  Trouble buttoning clothing: No. Memory changes:  No. N/V:  No. Lightheaded:  No.  Syncope: No. Diplopia:  No. Dyskinesia:  No.  He had a CT brain Jun 12, 2019 for headache.  Intracranially, this was unremarkable.  I personally reviewed it.   ALLERGIES:   Allergies  Allergen Reactions  . Penicillins Anaphylaxis  . Quinolones     Patient was warned about not using Cipro and similar antibiotics. Recent studies have raised concern that fluoroquinolone antibiotics could be associated with an increased risk of aortic aneurysm Fluoroquinolones have non-antimicrobial properties that might jeopardise the integrity of the extracellular matrix of the vascular wall In a  propensity score matched cohort study in [place], there was a 66% increased rate of aortic aneurysm or dissection associated with oral fluoroquinolone use, compared wit  . Statins     Cognitive impairment muscle pain  . Yellow Jacket Venom   . Sulfa Antibiotics Rash    CURRENT MEDICATIONS:  Current Outpatient Medications  Medication Instructions  . amLODipine (NORVASC) 5 MG tablet TAKE 1 TABLET(5 MG) BY MOUTH DAILY  . EPINEPHrine (EPI-PEN) 0.3 mg, Intramuscular, As needed  .  ezetimibe (ZETIA) 10 mg, Oral, Daily  . metoprolol succinate (TOPROL-XL) 25 MG 24 hr tablet TAKE 1 TABLET(25 MG) BY MOUTH DAILY  . Testosterone (ANDRODERM) 2 MG/24HR PT24 Androderm 2 mg/24 hour transdermal 24 hour patch  APP 1 PA ON SKIN D.  . Ubrogepant (UBRELVY) 50 MG TABS Take 50 mg once, may repeat after 1 hour x 1 prn    Objective:   PHYSICAL EXAMINATION:    VITALS:   Vitals:   06/13/20 0832  BP: 116/82  Pulse: 74  SpO2: 97%  Weight: 229 lb (103.9 kg)  Height: 6\' 2"  (1.88 m)    GEN:  The patient appears stated age and is in NAD. HEENT:  Normocephalic, atraumatic.  The mucous membranes are moist. The  superficial temporal arteries are without ropiness or tenderness. CV:  RRR Lungs:  CTAB Neck/HEME:  There are no carotid bruits bilaterally.  Neurological examination:  Orientation: The patient is alert and oriented x3.  Cranial nerves: There is good facial symmetry.  When left horizontal gaze is attempted, the right eye does not go beyond midline and he does get diplopia (this surprised him).  The visual fields are full to confrontational testing. The speech is fluent and clear. Soft palate rises symmetrically and there is no tongue deviation. Hearing is intact to conversational tone. Sensation: Sensation is intact to light touch throughout (facial, trunk, extremities). Vibration is intact at the bilateral big toe but it is decreased. There is no extinction with double simultaneous stimulation.  Motor: Strength is 5/5 in the bilateral upper and lower extremities.   Shoulder shrug is equal and symmetric.  There is no pronator drift.  He is able to squat down deeply and get up without the use of his hands. Deep tendon reflexes: Deep tendon reflexes are 2/4 at the bilateral biceps, triceps, brachioradialis, patella and 1/4 at the bilateral achilles. Plantar responses are downgoing bilaterally.  Movement examination: Tone: There is normal tone in the bilateral upper extremities.  The tone in the lower extremities is normal.  I was not able to identify spasticity or rigidity. Abnormal movements: There is no rest tremor. Coordination:  There is no decremation with RAM's, with any form of RAMS, including alternating supination and pronation of the forearm, hand opening and closing, finger taps, heel taps and toe taps. Gait and Station: The patient has no difficulty arising out of a deep-seated chair without the use of the hands. The patient's stride length is good.  He is able to ambulate in a tandem fashion.  He is able to heel toe walk.   I have reviewed and interpreted the following labs  independently   Chemistry      Component Value Date/Time   NA 138 06/06/2020 1408   NA 141 02/15/2019 0853   K 3.9 06/06/2020 1408   CL 102 06/06/2020 1408   CO2 27 06/06/2020 1408   BUN 9 06/06/2020 1408   BUN 13 02/15/2019 0853   CREATININE 0.76 06/06/2020 1408      Component Value Date/Time   CALCIUM 9.2 06/06/2020 1408   ALKPHOS 102 06/06/2020 1408   AST 19 06/06/2020 1408   ALT 21 06/06/2020 1408   BILITOT 1.3 (H) 06/06/2020 1408   BILITOT 0.6 02/15/2019 0853      Lab Results  Component Value Date   TSH 1.90 06/06/2020   Lab Results  Component Value Date   WBC 8.3 06/06/2020   HGB 15.3 06/06/2020   HCT 45.6 06/06/2020   MCV 89.1  06/06/2020   PLT 245.0 06/06/2020      Total time spent on today's visit was 45 minutes, including both face-to-face time and nonface-to-face time.  Time included that spent on review of records (prior notes available to me/labs/imaging if pertinent), discussing treatment and goals, answering patient's questions and coordinating care.  Cc:  Pincus Sanes, MD

## 2020-06-13 ENCOUNTER — Encounter: Payer: Self-pay | Admitting: Neurology

## 2020-06-13 ENCOUNTER — Other Ambulatory Visit (INDEPENDENT_AMBULATORY_CARE_PROVIDER_SITE_OTHER): Payer: Managed Care, Other (non HMO)

## 2020-06-13 ENCOUNTER — Other Ambulatory Visit: Payer: Self-pay

## 2020-06-13 ENCOUNTER — Ambulatory Visit (INDEPENDENT_AMBULATORY_CARE_PROVIDER_SITE_OTHER): Payer: Managed Care, Other (non HMO) | Admitting: Neurology

## 2020-06-13 VITALS — BP 116/82 | HR 74 | Ht 74.0 in | Wt 229.0 lb

## 2020-06-13 DIAGNOSIS — M5442 Lumbago with sciatica, left side: Secondary | ICD-10-CM | POA: Diagnosis not present

## 2020-06-13 DIAGNOSIS — M791 Myalgia, unspecified site: Secondary | ICD-10-CM

## 2020-06-13 DIAGNOSIS — M5441 Lumbago with sciatica, right side: Secondary | ICD-10-CM

## 2020-06-13 DIAGNOSIS — G8929 Other chronic pain: Secondary | ICD-10-CM

## 2020-06-13 DIAGNOSIS — H532 Diplopia: Secondary | ICD-10-CM

## 2020-06-13 DIAGNOSIS — M79605 Pain in left leg: Secondary | ICD-10-CM

## 2020-06-13 DIAGNOSIS — M79604 Pain in right leg: Secondary | ICD-10-CM

## 2020-06-13 LAB — CK: Total CK: 46 U/L (ref 7–232)

## 2020-06-13 LAB — VITAMIN B12: Vitamin B-12: 248 pg/mL (ref 211–911)

## 2020-06-13 NOTE — Patient Instructions (Signed)
A referral to [facility] has been placed for your MRI someone will contact you directly to schedule your appt. They are located at [address] Please contact them directly by calling [phone] with any questions regarding your referral.  Your provider has requested that you have labwork completed today. The lab is located on the Second floor at [address], within the [facility] office. When you get off the elevator, turn right and go in the  Endocrinology [address]; the first brown door on the left.  Tell the ladies behind the desk that you are there for lab work. If you are not called within 15 minutes please check with the front desk.   Once you complete your labs you are free to go. You will receive a call or message via MyChart with your lab results.        

## 2020-06-28 ENCOUNTER — Telehealth: Payer: Self-pay

## 2020-06-28 NOTE — Telephone Encounter (Signed)
Did you guys appeal the MRI because I don't think I have heard anything about it and [facility] doesn't deal with prior auth

## 2020-06-28 NOTE — Telephone Encounter (Signed)
[facility] called and [employer] denied Mri and lp, is shceduled for Sunday. Please advise, looks like an appeal was done and it was still denied per [facility]. They want to know to go ahead and cancel.[phone]

## 2020-06-29 ENCOUNTER — Encounter: Payer: Self-pay | Admitting: Internal Medicine

## 2020-06-29 DIAGNOSIS — W57XXXA Bitten or stung by nonvenomous insect and other nonvenomous arthropods, initial encounter: Secondary | ICD-10-CM

## 2020-06-29 NOTE — Telephone Encounter (Signed)
Monday at 11:45 or noon is fine.  Just put it on my schedule/calendar if you get it scheduled.

## 2020-06-29 NOTE — Telephone Encounter (Signed)
Case number is [id number].

## 2020-06-29 NOTE — Telephone Encounter (Signed)
Scheduled for 215 Monday at 07/03/2018 with Dr.Rebecca Tat

## 2020-06-29 NOTE — Telephone Encounter (Signed)
I called [employer] this am, Daniel Petty had sumbitted it with all the clinicals. It was denied, went through multiple people and the only option is a peer to peer, which noone is there today to assist. An appointment will need to be made with you with that MD. Can you advise, when you are available to schedule, So this will have to be cancelled and rescheduled at a later date. The number is [phone] option 14. Happy to arrange it, just let me know when you would like to do so first of the week.

## 2020-07-01 ENCOUNTER — Other Ambulatory Visit: Payer: Managed Care, Other (non HMO)

## 2020-07-02 ENCOUNTER — Telehealth: Payer: Self-pay

## 2020-07-02 NOTE — Telephone Encounter (Signed)
Awaiting on [facility]  for appt.

## 2020-07-02 NOTE — Telephone Encounter (Signed)
Did prior auth and got auth for MRI brain and L spine.  auth for both is same number:  [id number], exp:  09/11/20 at [facility].  Please schedule.

## 2020-07-02 NOTE — Telephone Encounter (Signed)
Approval given to [facility]. `

## 2020-07-03 NOTE — Telephone Encounter (Signed)
[facility] will contact patient to schedule MRIs. Approval number given.

## 2020-07-05 ENCOUNTER — Other Ambulatory Visit: Payer: Self-pay

## 2020-07-05 ENCOUNTER — Other Ambulatory Visit: Payer: Managed Care, Other (non HMO)

## 2020-07-05 DIAGNOSIS — W57XXXA Bitten or stung by nonvenomous insect and other nonvenomous arthropods, initial encounter: Secondary | ICD-10-CM

## 2020-07-11 LAB — SPECIMEN STATUS REPORT

## 2020-07-13 ENCOUNTER — Other Ambulatory Visit: Payer: Self-pay

## 2020-07-13 ENCOUNTER — Ambulatory Visit
Admission: RE | Admit: 2020-07-13 | Discharge: 2020-07-13 | Disposition: A | Discharge status: Home / Self Care | Payer: Managed Care, Other (non HMO) | Source: Ambulatory Visit | Attending: Neurology | Admitting: Neurology

## 2020-07-13 ENCOUNTER — Telehealth: Payer: Self-pay | Admitting: Neurology

## 2020-07-13 DIAGNOSIS — M5442 Lumbago with sciatica, left side: Secondary | ICD-10-CM

## 2020-07-13 DIAGNOSIS — G8929 Other chronic pain: Secondary | ICD-10-CM

## 2020-07-13 DIAGNOSIS — M79604 Pain in right leg: Secondary | ICD-10-CM

## 2020-07-13 NOTE — Telephone Encounter (Signed)
Let pt know that MRI brain is unremarkable and MRI lumbar spine did reveal degenerative/arthritic changes.  Not sure if that is reason he feels "heaviness" in the legs but I didn't see any spinal cord compression.  For now, I would recommend trial of Physical therapy if he is willing and see how he does with that and time.  If he gets worse, we can re-evaluate things.

## 2020-07-13 NOTE — Telephone Encounter (Signed)
Called and spoke to patient and informed him of MRI brain and MRI lumbar results and recommendations. Patient would like to give PT a try and is ok with a referral being sent to [facility].  Patient had no further questions or concerns.

## 2020-07-19 LAB — ROCKY MTN SPOTTED FVR AB, IGM-BLOOD: RMSF IgM: 0.2 index (ref 0.00–0.89)

## 2020-07-19 LAB — SPECIMEN STATUS REPORT

## 2020-07-19 LAB — LYME DISEASE SEROLOGY W/REFLEX: Lyme Total Antibody EIA: NEGATIVE

## 2020-07-19 LAB — ROCKY MTN SPOTTED FVR AB, IGG-BLOOD: RMSF IgG: NEGATIVE

## 2020-10-25 NOTE — Progress Notes (Signed)
Subjective:    Patient ID: Daniel Petty, male    DOB: [birth year], 60 y.o.   MRN: [id number]  This visit occurred during the SARS-CoV-2 public health emergency.  Safety protocols were in place, including screening questions prior to the visit, additional usage of staff PPE, and extensive cleaning of exam room while observing appropriate contact time as indicated for disinfecting solutions.    HPI The patient is here for an acute visit.  Lump on side/back - his wife noticed a lump on his left side about two weeks ago.  He never knew it was there.  He denies pain, itch - it is asymptomatic   Medications and allergies reviewed with patient and updated if appropriate.  Patient Active Problem List   Diagnosis Date Noted   Lump of skin 10/26/2020   Myalgia 06/06/2020   Tremor 06/06/2020   Recurrent cellulitis of lower leg 10/14/2019   Hyperglycemia 06/21/2019   Mixed hyperlipidemia 02/17/2018   Pulmonary nodule seen on imaging study 02/17/2018   Ascending aorta dilatation (HCC) 01/11/2018   Cough variant asthma 06/18/2017   SVT (supraventricular tachycardia) (HCC) 10/07/2016   Low testosterone 09/19/2016   Arthralgia 09/17/2016   PAC (premature atrial contraction) 09/13/2015   PVC (premature ventricular contraction) 09/13/2015   Migraines 05/14/2015   Osteoarthritis 05/14/2015   History of cardiac radiofrequency ablation 05/14/2015   Essential hypertension, benign 05/14/2015    Current Outpatient Medications on File Prior to Visit  Medication Sig Dispense Refill   amLODipine (NORVASC) 5 MG tablet TAKE 1 TABLET(5 MG) BY MOUTH DAILY 90 tablet 3   EPINEPHrine 0.3 mg/0.3 mL IJ SOAJ injection Inject 0.3 mg into the muscle as needed for anaphylaxis. 1 each 5   ezetimibe (ZETIA) 10 MG tablet Take 1 tablet (10 mg total) by mouth daily. 90 tablet 3   metoprolol succinate (TOPROL-XL) 25 MG 24 hr tablet TAKE 1 TABLET(25 MG) BY MOUTH DAILY 90 tablet 3   Ubrogepant (UBRELVY) 50 MG TABS  Take 50 mg once, may repeat after 1 hour x 1 prn 10 tablet 1   No current facility-administered medications on file prior to visit.    Past Medical History:  Diagnosis Date   Arthritis    Ascending aorta dilation (HCC)    Hyperlipemia    Hypertension    Low testosterone    Migraine    Premature atrial contractions    PSVT (paroxysmal supraventricular tachycardia) (HCC)    a. syncope/HR 200s -> SVT/AVNRT s/p ablation 2015.   PVC's (premature ventricular contractions)    Syncope    Tachycardia    6 years ago while in the [place]    Past Surgical History:  Procedure Laterality Date   APPENDECTOMY     CHOLECYSTECTOMY     psvt typical     TONSILLECTOMY     typical AVNRT      Social History   Socioeconomic History   Marital status: Married    Spouse name: Not on file   Number of children: 2   Years of education: Not on file   Highest education level: Not on file  Occupational History   Not on file  Tobacco Use   Smoking status: Never   Smokeless tobacco: Never  Vaping Use   Vaping Use: Never used  Substance and Sexual Activity   Alcohol use: Yes    Alcohol/week: 1.0 standard drink    Types: 1 Cans of beer per week   Drug use: No   Sexual activity: Not  on file  Other Topics Concern   Not on file  Social History Narrative   Exercise: regular   Left Handed    Lives in a two story home    Drinks Caffeine- a cup of coffee a day    Social Determinants of Health   Financial Resource Strain: Not on file  Food Insecurity: Not on file  Transportation Needs: Not on file  Physical Activity: Not on file  Stress: Not on file  Social Connections: Not on file    Family History  Problem Relation Age of Onset   Hyperlipidemia Mother    Heart attack Mother    Multiple sclerosis Mother    Arrhythmia Mother    Allergies Mother    Heart disease Father    Allergies Father    Multiple sclerosis Brother    Hyperlipidemia Maternal Grandmother     Review of Systems      Objective:   Vitals:   10/26/20 1327  BP: (!) 150/90  Pulse: 70  Temp: 98.5 F (36.9 C)  SpO2: 98%   BP Readings from Last 3 Encounters:  10/26/20 (!) 150/90  06/13/20 116/82  06/07/20 (!) 150/90   Wt Readings from Last 3 Encounters:  10/26/20 222 lb (100.7 kg)  06/13/20 229 lb (103.9 kg)  06/07/20 228 lb (103.4 kg)   Body mass index is 28.5 kg/m.   Physical Exam Constitutional:      General: He is not in acute distress.    Appearance: Normal appearance. He is not ill-appearing.  Skin:    General: Skin is warm and dry.     Comments: Walnut sized skin lump that is mobile, nontender - probable lipoma  Neurological:     Mental Status: He is alert.           Assessment & Plan:     Screened for depression using the PHQ 9 scale.  No evidence of depression.   Screened for anxiety using GAD7 Scale.  No evidence of anxiety.    See Problem List for Assessment and Plan of chronic medical problems.

## 2020-10-26 ENCOUNTER — Other Ambulatory Visit: Payer: Self-pay

## 2020-10-26 ENCOUNTER — Ambulatory Visit (INDEPENDENT_AMBULATORY_CARE_PROVIDER_SITE_OTHER): Payer: Managed Care, Other (non HMO) | Admitting: Internal Medicine

## 2020-10-26 ENCOUNTER — Encounter: Payer: Self-pay | Admitting: Internal Medicine

## 2020-10-26 VITALS — BP 150/90 | HR 70 | Temp 98.5°F | Ht 74.0 in | Wt 222.0 lb

## 2020-10-26 DIAGNOSIS — R229 Localized swelling, mass and lump, unspecified: Secondary | ICD-10-CM | POA: Insufficient documentation

## 2020-10-26 DIAGNOSIS — Z1331 Encounter for screening for depression: Secondary | ICD-10-CM | POA: Diagnosis not present

## 2020-10-26 NOTE — Patient Instructions (Signed)
  Your skin lump is likely a lipoma.  A [place] of your skin lump was ordered.   Someone from their office will call you to schedule an appointment.

## 2020-10-26 NOTE — Assessment & Plan Note (Signed)
Acute His wife noticed it two weeks ago Asymptomatic Likely lipoma Will get [place] to confirm lipoma reassured

## 2020-11-01 ENCOUNTER — Ambulatory Visit
Admission: RE | Admit: 2020-11-01 | Discharge: 2020-11-01 | Disposition: A | Discharge status: Home / Self Care | Payer: Managed Care, Other (non HMO) | Source: Ambulatory Visit | Attending: Internal Medicine | Admitting: Internal Medicine

## 2020-11-01 DIAGNOSIS — R229 Localized swelling, mass and lump, unspecified: Secondary | ICD-10-CM

## 2021-03-06 ENCOUNTER — Ambulatory Visit: Payer: Managed Care, Other (non HMO) | Admitting: Interventional Cardiology

## 2021-03-09 ENCOUNTER — Other Ambulatory Visit: Payer: Self-pay | Admitting: Interventional Cardiology

## 2021-03-09 DIAGNOSIS — I10 Essential (primary) hypertension: Secondary | ICD-10-CM

## 2021-03-12 ENCOUNTER — Encounter: Payer: Self-pay | Admitting: Interventional Cardiology

## 2021-03-12 DIAGNOSIS — I10 Essential (primary) hypertension: Secondary | ICD-10-CM

## 2021-03-12 MED ORDER — AMLODIPINE BESYLATE 5 MG PO TABS: 5.0000 mg | ORAL_TABLET | Freq: Every day | ORAL | 0 refills | Status: AC

## 2021-03-12 MED ORDER — METOPROLOL SUCCINATE ER 25 MG PO TB24: 25.0000 mg | ORAL_TABLET | Freq: Every day | ORAL | 0 refills | Status: DC

## 2021-05-18 ENCOUNTER — Other Ambulatory Visit: Payer: Self-pay | Admitting: Internal Medicine

## 2021-06-20 ENCOUNTER — Other Ambulatory Visit: Payer: Self-pay | Admitting: Interventional Cardiology

## 2021-12-02 ENCOUNTER — Ambulatory Visit: Payer: Self-pay | Admitting: Thoracic Surgery (Cardiothoracic Vascular Surgery)
